# Patient Record
Sex: Male | Born: 1990 | Race: White | Hispanic: No | Marital: Single | Smoking: Former smoker
Health system: Southern US, Community
[De-identification: ages and names within clinical notes are randomized; demographics above are authoritative.]

## PROBLEM LIST (undated history)

## (undated) DIAGNOSIS — J45909 Unspecified asthma, uncomplicated: Secondary | ICD-10-CM

## (undated) DIAGNOSIS — M419 Scoliosis, unspecified: Secondary | ICD-10-CM

---
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
## 2011-04-24 ENCOUNTER — Emergency Department (HOSPITAL_COMMUNITY)
Admission: EM | Admit: 2011-04-24 | Discharge: 2011-04-24 | Disposition: A | Discharge status: Home / Self Care | Payer: Self-pay | Attending: Emergency Medicine | Admitting: Emergency Medicine

## 2011-04-24 ENCOUNTER — Encounter (HOSPITAL_COMMUNITY): Payer: Self-pay | Admitting: *Deleted

## 2011-04-24 ENCOUNTER — Emergency Department (HOSPITAL_COMMUNITY): Payer: Self-pay

## 2011-04-24 DIAGNOSIS — M47817 Spondylosis without myelopathy or radiculopathy, lumbosacral region: Secondary | ICD-10-CM | POA: Insufficient documentation

## 2011-04-24 DIAGNOSIS — X58XXXA Exposure to other specified factors, initial encounter: Secondary | ICD-10-CM | POA: Insufficient documentation

## 2011-04-24 DIAGNOSIS — S335XXA Sprain of ligaments of lumbar spine, initial encounter: Secondary | ICD-10-CM | POA: Insufficient documentation

## 2011-04-24 DIAGNOSIS — S39012A Strain of muscle, fascia and tendon of lower back, initial encounter: Secondary | ICD-10-CM

## 2011-04-24 MED ORDER — CYCLOBENZAPRINE HCL 10 MG PO TABS
10.0000 mg | ORAL_TABLET | Freq: Once | ORAL | Status: AC
  Administered 2011-04-24: 10 mg via ORAL
  Filled 2011-04-24: qty 1

## 2011-04-24 MED ORDER — CYCLOBENZAPRINE HCL 10 MG PO TABS: ORAL_TABLET | ORAL | Status: DC

## 2011-04-24 MED ORDER — IBUPROFEN 800 MG PO TABS
800.0000 mg | ORAL_TABLET | Freq: Once | ORAL | Status: AC
  Administered 2011-04-24: 800 mg via ORAL
  Filled 2011-04-24: qty 1

## 2011-04-24 NOTE — ED Provider Notes (Signed)
History     CSN: [id number]  Arrival date & time 04/24/11  1600   None     Chief Complaint  Patient presents with  . Back Pain    (Consider location/radiation/quality/duration/timing/severity/associated sxs/prior treatment) Patient is a 21 y.o. male presenting with back pain. The history is provided by the patient. No language interpreter was used.  Back Pain  This is a new problem. Episode onset: one month ago. The problem occurs constantly. The problem has not changed since onset.The pain is associated with no known injury. The pain is present in the lumbar spine. The quality of the pain is described as aching. The pain does not radiate. The pain is moderate. The pain is worse during the day. Pertinent negatives include no fever, no numbness, no paresthesias, no paresis and no weakness. He has tried NSAIDs for the symptoms. The treatment provided mild relief.    History reviewed. No pertinent past medical history.  History reviewed. No pertinent past surgical history.  History reviewed. No pertinent family history.  History  Substance Use Topics  . Smoking status: Current Everyday Smoker  . Smokeless tobacco: Not on file  . Alcohol Use: No      Review of Systems  Constitutional: Negative for fever and chills.  Musculoskeletal: Positive for back pain.  Neurological: Negative for weakness, numbness and paresthesias.  All other systems reviewed and are negative.    Allergies  Review of patient's allergies indicates no known allergies.  Home Medications   Current Outpatient Rx  Name Route Sig Dispense Refill  . CYCLOBENZAPRINE HCL 10 MG PO TABS  1/2 to one tab po TID prn low back pain 20 tablet 0    BP 133/64  Pulse 61  Temp 97.4 F (36.3 C)  Resp 18  SpO2 100%  Physical Exam  Nursing note and vitals reviewed. Constitutional: He is oriented to person, place, and time. He appears well-developed and well-nourished.  HENT:  Head: Normocephalic and  atraumatic.  Eyes: EOM are normal.  Neck: Normal range of motion.  Cardiovascular: Normal rate, regular rhythm, normal heart sounds and intact distal pulses.   Pulmonary/Chest: Effort normal and breath sounds normal. No respiratory distress.  Abdominal: Soft. He exhibits no distension. There is no tenderness.  Musculoskeletal: He exhibits tenderness.       Lumbar back: He exhibits decreased range of motion, tenderness, bony tenderness and pain. He exhibits no swelling, no deformity and no laceration.       Back:  Neurological: He is alert and oriented to person, place, and time. He has normal strength. No sensory deficit. Coordination and gait normal. GCS eye subscore is 4. GCS verbal subscore is 5. GCS motor subscore is 6.  Reflex Scores:      Patellar reflexes are 2+ on the right side and 2+ on the left side.      Achilles reflexes are 2+ on the right side and 2+ on the left side. Skin: Skin is warm and dry.  Psychiatric: He has a normal mood and affect. Judgment normal.    ED Course  Procedures (including critical care time)  Labs Reviewed - No data to display No results found.   1. Lumbar strain       MDM  Doubt spinal abscess.  Doubt caudal equina .  sxs c/w muscle strain. rx flexeril, 20 Ibuprofen 600 mg TID with food Ice F/u at health dept.        Worthy Rancher, PA 05/03/11 [phone]

## 2011-04-24 NOTE — Discharge Instructions (Signed)
Cryotherapy Cryotherapy means treatment with cold. Ice or gel packs can be used to reduce both pain and swelling. Ice is the most helpful within the first 24 to 48 hours after an injury or flareup from overusing a muscle or joint. Sprains, strains, spasms, burning pain, shooting pain, and aches can all be eased with ice. Ice can also be used when recovering from surgery. Ice is effective, has very few side effects, and is safe for most people to use. PRECAUTIONS  Ice is not a safe treatment option for people with:  Raynaud's phenomenon. This is a condition affecting small blood vessels in the extremities. Exposure to cold may cause your problems to return.   Cold hypersensitivity. There are many forms of cold hypersensitivity, including:   Cold urticaria. Red, itchy hives appear on the skin when the tissues begin to warm after being iced.   Cold erythema. This is a red, itchy rash caused by exposure to cold.   Cold hemoglobinuria. Red blood cells break down when the tissues begin to warm after being iced. The hemoglobin that carry oxygen are passed into the urine because they cannot combine with blood proteins fast enough.   Numbness or altered sensitivity in the area being iced.  If you have any of the following conditions, do not use ice until you have discussed cryotherapy with your caregiver:  Heart conditions, such as arrhythmia, angina, or chronic heart disease.   High blood pressure.   Healing wounds or open skin in the area being iced.   Current infections.   Rheumatoid arthritis.   Poor circulation.   Diabetes.  Ice slows the blood flow in the region it is applied. This is beneficial when trying to stop inflamed tissues from spreading irritating chemicals to surrounding tissues. However, if you expose your skin to cold temperatures for too long or without the proper protection, you can damage your skin or nerves. Watch for signs of skin damage due to cold. HOME CARE  INSTRUCTIONS Follow these tips to use ice and cold packs safely.  Place a dry or damp towel between the ice and skin. A damp towel will cool the skin more quickly, so you may need to shorten the time that the ice is used.   For a more rapid response, add gentle compression to the ice.   Ice for no more than 10 to 20 minutes at a time. The bonier the area you are icing, the less time it will take to get the benefits of ice.   Check your skin after 5 minutes to make sure there are no signs of a poor response to cold or skin damage.   Rest 20 minutes or more in between uses.   Once your skin is numb, you can end your treatment. You can test numbness by very lightly touching your skin. The touch should be so light that you do not see the skin dimple from the pressure of your fingertip. When using ice, most people will feel these normal sensations in this order: cold, burning, aching, and numbness.   Do not use ice on someone who cannot communicate their responses to pain, such as small children or people with dementia.  HOW TO MAKE AN ICE PACK Ice packs are the most common way to use ice therapy. Other methods include ice massage, ice baths, and cryo-sprays. Muscle creams that cause a cold, tingly feeling do not offer the same benefits that ice offers and should not be used as a substitute  unless recommended by your caregiver. To make an ice pack, do one of the following:  Place crushed ice or a bag of frozen vegetables in a sealable plastic bag. Squeeze out the excess air. Place this bag inside another plastic bag. Slide the bag into a pillowcase or place a damp towel between your skin and the bag.   Mix 3 parts water with 1 part rubbing alcohol. Freeze the mixture in a sealable plastic bag. When you remove the mixture from the freezer, it will be slushy. Squeeze out the excess air. Place this bag inside another plastic bag. Slide the bag into a pillowcase or place a damp towel between your skin  and the bag.  SEEK MEDICAL CARE IF:  You develop white spots on your skin. This may give the skin a blotchy (mottled) appearance.   Your skin turns blue or pale.   Your skin becomes waxy or hard.   Your swelling gets worse.  MAKE SURE YOU:   Understand these instructions.   Will watch your condition.   Will get help right away if you are not doing well or get worse.  Document Released: 08/18/2010 Document Revised: 12/11/2010 Document Reviewed: 08/18/2010 [facility] Patient Information 2012 [place], [place].Muscle Strain A muscle strain, or pulled muscle, occurs when a muscle is over-stretched. A small number of muscle fibers may also be torn. This is especially common in athletes. This happens when a sudden violent force placed on a muscle pushes it past its capacity. Usually, recovery from a pulled muscle takes 1 to 2 weeks. But complete healing will take 5 to 6 weeks. There are millions of muscle fibers. Following injury, your body will usually return to normal quickly. HOME CARE INSTRUCTIONS   While awake, apply ice to the sore muscle for 15 to 20 minutes each hour for the first 2 days. Put ice in a plastic bag and place a towel between the bag of ice and your skin.   Do not use the pulled muscle for several days. Do not use the muscle if you have pain.   You may wrap the injured area with an elastic bandage for comfort. Be careful not to bind it too tightly. This may interfere with blood circulation.   Only take over-the-counter or prescription medicines for pain, discomfort, or fever as directed by your caregiver. Do not use aspirin as this will increase bleeding (bruising) at injury site.   Warming up before exercise helps prevent muscle strains.  SEEK MEDICAL CARE IF:  There is increased pain or swelling in the affected area. MAKE SURE YOU:   Understand these instructions.   Will watch your condition.   Will get help right away if you are not doing well or get worse.    Document Released: 12/22/2004 Document Revised: 12/11/2010 Document Reviewed: 07/21/2006 [facility] Patient Information 2012 [place], [place].   Take the flexeril as directed.  Take ibuprofen up to 600 mg every 8 hrs with food.  Apply ice  Several times daily to lower back.  Call the health dept and make arrangements for routine medical care.  Call either of the orthopedic referral for further evaluation of your low back pain.

## 2011-04-24 NOTE — ED Notes (Signed)
Pt presents to er with c/o lower back pain that started hurting about a month ago, states that it has gotten worse over the past few days, pain worse with movement, denies any injury,

## 2011-05-05 NOTE — ED Provider Notes (Signed)
Medical screening examination/treatment/procedure(s) were performed by non-physician practitioner and as supervising physician I was immediately available for consultation/collaboration.   Glennon Kopko L Emmaleigh Longo, MD 05/05/11 1239 

## 2011-06-27 ENCOUNTER — Encounter (HOSPITAL_COMMUNITY): Payer: Self-pay | Admitting: *Deleted

## 2011-06-27 ENCOUNTER — Emergency Department (HOSPITAL_COMMUNITY)
Admission: EM | Admit: 2011-06-27 | Discharge: 2011-06-28 | Disposition: A | Discharge status: Home / Self Care | Payer: Self-pay | Attending: Emergency Medicine | Admitting: Emergency Medicine

## 2011-06-27 DIAGNOSIS — R112 Nausea with vomiting, unspecified: Secondary | ICD-10-CM | POA: Insufficient documentation

## 2011-06-27 DIAGNOSIS — R197 Diarrhea, unspecified: Secondary | ICD-10-CM | POA: Insufficient documentation

## 2011-06-27 NOTE — ED Notes (Signed)
Pt c/o n/v/d x 1 wk.

## 2011-06-28 LAB — DIFFERENTIAL
Basophils Absolute: 0.1 10*3/uL (ref 0.0–0.1)
Basophils Relative: 1 % (ref 0–1)
Eosinophils Absolute: 0.1 10*3/uL (ref 0.0–0.7)
Eosinophils Relative: 1 % (ref 0–5)
Lymphocytes Relative: 49 % — ABNORMAL HIGH (ref 12–46)
Lymphs Abs: 3.6 10*3/uL (ref 0.7–4.0)
Monocytes Absolute: 0.6 10*3/uL (ref 0.1–1.0)
Monocytes Relative: 9 % (ref 3–12)
Neutro Abs: 2.9 10*3/uL (ref 1.7–7.7)
Neutrophils Relative %: 40 % — ABNORMAL LOW (ref 43–77)

## 2011-06-28 LAB — URINALYSIS, ROUTINE W REFLEX MICROSCOPIC
Bilirubin Urine: NEGATIVE
Glucose, UA: NEGATIVE mg/dL
Hgb urine dipstick: NEGATIVE
Ketones, ur: NEGATIVE mg/dL
Leukocytes, UA: NEGATIVE
Nitrite: NEGATIVE
Protein, ur: NEGATIVE mg/dL
Specific Gravity, Urine: 1.025 (ref 1.005–1.030)
Urobilinogen, UA: 0.2 mg/dL (ref 0.0–1.0)
pH: 6.5 (ref 5.0–8.0)

## 2011-06-28 LAB — COMPREHENSIVE METABOLIC PANEL
Alkaline Phosphatase: 70 U/L (ref 39–117)
BUN: 14 mg/dL (ref 6–23)
CO2: 26 mEq/L (ref 19–32)
Chloride: 101 mEq/L (ref 96–112)
Creatinine, Ser: 1 mg/dL (ref 0.50–1.35)
GFR calc Af Amer: >90 mL/min (ref >90)
GFR calc non Af Amer: >90 mL/min (ref >90)
Glucose, Bld: 56 mg/dL — ABNORMAL LOW (ref 70–99)
Potassium: 3.6 mEq/L (ref 3.5–5.1)
Total Bilirubin: 0.3 mg/dL (ref 0.3–1.2)

## 2011-06-28 LAB — CBC
HCT: 38.6 % — ABNORMAL LOW (ref 39.0–52.0)
Hemoglobin: 13.8 g/dL (ref 13.0–17.0)
MCH: 33.3 pg (ref 26.0–34.0)
MCHC: 35.8 g/dL (ref 30.0–36.0)
MCV: 93 fL (ref 78.0–100.0)
Platelets: 242 10*3/uL (ref 150–400)
RBC: 4.15 MIL/uL — ABNORMAL LOW (ref 4.22–5.81)
RDW: 11.9 % (ref 11.5–15.5)
WBC: 7.2 10*3/uL (ref 4.0–10.5)

## 2011-06-28 MED ORDER — ONDANSETRON 4 MG PO TBDP
4.0000 mg | ORAL_TABLET | Freq: Once | ORAL | Status: AC
  Administered 2011-06-28: 4 mg via ORAL
  Filled 2011-06-28: qty 1

## 2011-06-28 MED ORDER — ONDANSETRON HCL 4 MG PO TABS: 4.0000 mg | ORAL_TABLET | Freq: Four times a day (QID) | ORAL | Status: AC

## 2011-06-28 NOTE — ED Provider Notes (Signed)
History     CSN: [id number]  Arrival date & time 06/27/11  2239   First MD Initiated Contact with Patient 06/27/11 2354      Chief Complaint  Patient presents with  . Nausea  . Emesis  . Diarrhea    (Consider location/radiation/quality/duration/timing/severity/associated sxs/prior treatment) HPI Comments: Patient presents with nausea, vomiting and diarrhea for the past 3 days. He sat up to 2 episodes of vomiting daily in 4-5 loose stools daily. He states he's had no diarrhea today use of vomiting. Denies abdominal pain, fever, chest pain, shortness of breath. No sick contacts. No pain with urination. No recent travel or unusual foods. No blood in emesis or stool.  The history is provided by the patient.    History reviewed. No pertinent past medical history.  History reviewed. No pertinent past surgical history.  History reviewed. No pertinent family history.  History  Substance Use Topics  . Smoking status: Current Everyday Smoker  . Smokeless tobacco: Not on file  . Alcohol Use: No      Review of Systems  Constitutional: Negative for fever and activity change.  HENT: Negative for congestion and rhinorrhea.   Respiratory: Negative for cough, chest tightness and shortness of breath.   Cardiovascular: Negative for chest pain.  Gastrointestinal: Positive for nausea, vomiting and diarrhea. Negative for abdominal pain.  Genitourinary: Negative for dysuria and hematuria.  Musculoskeletal: Negative for back pain.  Skin: Negative for rash.  Neurological: Negative for dizziness, weakness, light-headedness and headaches.  Hematological: Negative for adenopathy.    Allergies  Review of patient's allergies indicates no known allergies.  Home Medications  No current outpatient prescriptions on file.  BP 122/58  Pulse 51  Temp 97.9 F (36.6 C) (Oral)  Resp 16  Ht 5\' 6"  (1.676 m)  Wt 120 lb (54.432 kg)  BMI 19.37 kg/m2  SpO2 100%  Physical Exam  Constitutional: He  is oriented to person, place, and time. He appears well-developed and well-nourished. No distress.  HENT:  Head: Normocephalic and atraumatic.  Mouth/Throat: Oropharynx is clear and moist. No oropharyngeal exudate.       Moist mucous membranes  Eyes: Conjunctivae and EOM are normal. Pupils are equal, round, and reactive to light.  Neck: Normal range of motion. Neck supple.  Cardiovascular: Normal rate, regular rhythm and normal heart sounds.   No murmur heard. Pulmonary/Chest: Effort normal and breath sounds normal. No respiratory distress.  Abdominal: Soft. There is no tenderness. There is no rebound and no guarding.  Musculoskeletal: Normal range of motion. He exhibits no edema and no tenderness.  Neurological: He is alert and oriented to person, place, and time. No cranial nerve deficit.  Skin: Skin is warm.    ED Course  Procedures (including critical care time)  Labs Reviewed  CBC - Abnormal; Notable for the following:    RBC 4.15 (*)     HCT 38.6 (*)     All other components within normal limits  DIFFERENTIAL - Abnormal; Notable for the following:    Neutrophils Relative 40 (*)     Lymphocytes Relative 49 (*)     All other components within normal limits  COMPREHENSIVE METABOLIC PANEL - Abnormal; Notable for the following:    Glucose, Bld 56 (*)     All other components within normal limits  URINALYSIS, ROUTINE W REFLEX MICROSCOPIC   No results found.   No diagnosis found.    MDM  3 days of nausea, vomiting and diarrhea. Abdomen benign. Vitals stable.  Mucous membranes moist. Suspect viral gastroenteritis. By mouth Zofran, fluid  No vomiting in the ED. Tolerating PO and stable for discharge.      Glynn Octave, MD 06/28/11 [phone]

## 2011-06-28 NOTE — Discharge Instructions (Signed)
Nausea and Vomiting  Nausea is a sick feeling that often comes before throwing up (vomiting). Vomiting is a reflex where stomach contents come out of your mouth. Vomiting can cause severe loss of body fluids (dehydration). Children and elderly adults can become dehydrated quickly, especially if they also have diarrhea. Nausea and vomiting are symptoms of a condition or disease. It is important to find the cause of your symptoms.  CAUSES    Direct irritation of the stomach lining. This irritation can result from increased acid production (gastroesophageal reflux disease), infection, food poisoning, taking certain medicines (such as nonsteroidal anti-inflammatory drugs), alcohol use, or tobacco use.   Signals from the brain.These signals could be caused by a headache, heat exposure, an inner ear disturbance, increased pressure in the brain from injury, infection, a tumor, or a concussion, pain, emotional stimulus, or metabolic problems.   An obstruction in the gastrointestinal tract (bowel obstruction).   Illnesses such as diabetes, hepatitis, gallbladder problems, appendicitis, kidney problems, cancer, sepsis, atypical symptoms of a heart attack, or eating disorders.   Medical treatments such as chemotherapy and radiation.   Receiving medicine that makes you sleep (general anesthetic) during surgery.  DIAGNOSIS  Your caregiver may ask for tests to be done if the problems do not improve after a few days. Tests may also be done if symptoms are severe or if the reason for the nausea and vomiting is not clear. Tests may include:   Urine tests.   Blood tests.   Stool tests.   Cultures (to look for evidence of infection).   X-rays or other imaging studies.  Test results can help your caregiver make decisions about treatment or the need for additional tests.  TREATMENT  You need to stay well hydrated. Drink frequently but in small amounts.You may wish to drink water, sports drinks, clear broth, or eat frozen  ice pops or gelatin dessert to help stay hydrated.When you eat, eating slowly may help prevent nausea.There are also some antinausea medicines that may help prevent nausea.  HOME CARE INSTRUCTIONS    Take all medicine as directed by your caregiver.   If you do not have an appetite, do not force yourself to eat. However, you must continue to drink fluids.   If you have an appetite, eat a normal diet unless your caregiver tells you differently.   Eat a variety of complex carbohydrates (rice, wheat, potatoes, bread), lean meats, yogurt, fruits, and vegetables.   Avoid high-fat foods because they are more difficult to digest.   Drink enough water and fluids to keep your urine clear or pale yellow.   If you are dehydrated, ask your caregiver for specific rehydration instructions. Signs of dehydration may include:   Severe thirst.   Dry lips and mouth.   Dizziness.   Dark urine.   Decreasing urine frequency and amount.   Confusion.   Rapid breathing or pulse.  SEEK IMMEDIATE MEDICAL CARE IF:    You have blood or brown flecks (like coffee grounds) in your vomit.   You have black or bloody stools.   You have a severe headache or stiff neck.   You are confused.   You have severe abdominal pain.   You have chest pain or trouble breathing.   You do not urinate at least once every 8 hours.   You develop cold or clammy skin.   You continue to vomit for longer than 24 to 48 hours.   You have a fever.  MAKE SURE YOU:      Understand these instructions.   Will watch your condition.   Will get help right away if you are not doing well or get worse.  Document Released: 12/22/2004 Document Revised: 12/11/2010 Document Reviewed: 05/21/2010  [employer] Patient Information 2012 [employer].

## 2012-03-07 ENCOUNTER — Encounter (HOSPITAL_COMMUNITY): Payer: Self-pay

## 2012-03-07 ENCOUNTER — Emergency Department (HOSPITAL_COMMUNITY)
Admission: EM | Admit: 2012-03-07 | Discharge: 2012-03-07 | Disposition: A | Discharge status: Home / Self Care | Payer: Self-pay | Attending: Emergency Medicine | Admitting: Emergency Medicine

## 2012-03-07 DIAGNOSIS — M47817 Spondylosis without myelopathy or radiculopathy, lumbosacral region: Secondary | ICD-10-CM | POA: Insufficient documentation

## 2012-03-07 MED ORDER — PREDNISONE 10 MG PO TABS: ORAL_TABLET | ORAL | Status: DC

## 2012-03-07 MED ORDER — DICLOFENAC SODIUM 75 MG PO TBEC: 75.0000 mg | DELAYED_RELEASE_TABLET | Freq: Two times a day (BID) | ORAL | Status: DC

## 2012-03-07 NOTE — ED Notes (Signed)
Low back pain for "a year".  No injury known.  Pain has been worse last few days.

## 2012-03-07 NOTE — ED Notes (Signed)
No answer in waiting room X1 

## 2012-03-07 NOTE — ED Provider Notes (Signed)
History     CSN: [id number]  Arrival date & time 03/07/12  1325   First MD Initiated Contact with Patient 03/07/12 1434      Chief Complaint  Patient presents with  . Back Pain    (Consider location/radiation/quality/duration/timing/severity/associated sxs/prior treatment) Patient is a 22 y.o. male presenting with back pain. The history is provided by the patient. No language interpreter was used.  Back Pain Location:  Lumbar spine Quality:  Aching Pain severity:  No pain Pain is:  Worse during the day Timing:  Constant Chronicity:  New Pt complains pain in his low back.  Pt reports he was seen here for similar.   Pt has not had follow up.    History reviewed. No pertinent past medical history.  History reviewed. No pertinent past surgical history.  No family history on file.  History  Substance Use Topics  . Smoking status: Never Smoker   . Smokeless tobacco: Current User    Types: Snuff  . Alcohol Use: No      Review of Systems  Musculoskeletal: Positive for back pain.  All other systems reviewed and are negative.    Allergies  Review of patient's allergies indicates no known allergies.  Home Medications  No current outpatient prescriptions on file.  BP 124/61  Temp(Src) 97.8 F (36.6 C) (Oral)  Resp 18  Ht 5\' 10"  (1.778 m)  Wt 120 lb (54.432 kg)  BMI 17.22 kg/m2  SpO2 100%  Physical Exam  Nursing note and vitals reviewed. Constitutional: He is oriented to person, place, and time. He appears well-developed and well-nourished.  HENT:  Head: Normocephalic.  Right Ear: External ear normal.  Left Ear: External ear normal.  Eyes: Conjunctivae and EOM are normal. Pupils are equal, round, and reactive to light.  Neck: Normal range of motion. Neck supple.  Cardiovascular: Normal rate and normal heart sounds.   Pulmonary/Chest: Effort normal.  Abdominal: Soft.  Musculoskeletal: He exhibits tenderness.  Tender lower lumbar spine.     Neurological: He  is alert and oriented to person, place, and time. He has normal reflexes.  Skin: Skin is warm.  Psychiatric: He has a normal mood and affect.    ED Course  Procedures (including critical care time)  Labs Reviewed - No data to display No results found.   No diagnosis found.    MDM  Pt advised to follow up with Orthopaedist for evaluation        Elson Areas, [place] 03/07/12 1508

## 2012-03-07 NOTE — ED Notes (Signed)
Pt arrives to er with c/o back pain. He states he came here a few months ago with this complaint and the Dr told him his cartilage was breaking down. Pt denies any injury to back but states his back pain radiates down his left arm.

## 2012-03-08 NOTE — ED Provider Notes (Signed)
Medical screening examination/treatment/procedure(s) were performed by non-physician practitioner and as supervising physician I was immediately available for consultation/collaboration.   Laray Anger, DO 03/08/12 1132

## 2012-09-14 ENCOUNTER — Emergency Department (HOSPITAL_COMMUNITY)
Admission: EM | Admit: 2012-09-14 | Discharge: 2012-09-14 | Disposition: A | Discharge status: Home / Self Care | Payer: Self-pay | Attending: Emergency Medicine | Admitting: Emergency Medicine

## 2012-09-14 ENCOUNTER — Encounter (HOSPITAL_COMMUNITY): Payer: Self-pay | Admitting: *Deleted

## 2012-09-14 DIAGNOSIS — S61409A Unspecified open wound of unspecified hand, initial encounter: Secondary | ICD-10-CM | POA: Insufficient documentation

## 2012-09-14 DIAGNOSIS — W268XXA Contact with other sharp object(s), not elsewhere classified, initial encounter: Secondary | ICD-10-CM | POA: Insufficient documentation

## 2012-09-14 DIAGNOSIS — Y929 Unspecified place or not applicable: Secondary | ICD-10-CM | POA: Insufficient documentation

## 2012-09-14 DIAGNOSIS — S61411A Laceration without foreign body of right hand, initial encounter: Secondary | ICD-10-CM

## 2012-09-14 DIAGNOSIS — Y939 Activity, unspecified: Secondary | ICD-10-CM | POA: Insufficient documentation

## 2012-09-14 DIAGNOSIS — Z23 Encounter for immunization: Secondary | ICD-10-CM | POA: Insufficient documentation

## 2012-09-14 MED ORDER — TETANUS-DIPHTH-ACELL PERTUSSIS 5-2.5-18.5 LF-MCG/0.5 IM SUSP
0.5000 mL | Freq: Once | INTRAMUSCULAR | Status: AC
  Administered 2012-09-14: 0.5 mL via INTRAMUSCULAR
  Filled 2012-09-14: qty 0.5

## 2012-09-14 MED ORDER — LIDOCAINE HCL (PF) 1 % IJ SOLN
5.0000 mL | Freq: Once | INTRAMUSCULAR | Status: AC
  Administered 2012-09-14: 5 mL via INTRADERMAL
  Filled 2012-09-14: qty 5

## 2012-09-14 NOTE — ED Notes (Signed)
Pt cut right palm on a piece of siding earlier today. Bleeding controlled.

## 2012-09-14 NOTE — ED Provider Notes (Signed)
CSN: [id number]     Arrival date & time 09/14/12  1924 History   First MD Initiated Contact with Patient 09/14/12 1947     Chief Complaint  Patient presents with  . Extremity Laceration   (Consider location/radiation/quality/duration/timing/severity/associated sxs/prior Treatment) Patient is a 22 y.o. male presenting with skin laceration. The history is provided by the patient.  Laceration Location:  Hand Hand laceration location: Right thenar eminence. Length (cm):  2 Depth:  Through dermis Quality: straight   Bleeding: controlled   Time since incident:  5 hours Laceration mechanism:  Metal edge Pain details:    Quality:  Aching   Severity:  Mild   Timing:  Constant   Progression:  Unchanged Foreign body present:  No foreign bodies Relieved by:  Nothing Worsened by:  Movement Ineffective treatments:  None tried Tetanus status:  Unknown   History reviewed. No pertinent past medical history. History reviewed. No pertinent past surgical history. History reviewed. No pertinent family history. History  Substance Use Topics  . Smoking status: Never Smoker   . Smokeless tobacco: Current User    Types: Snuff  . Alcohol Use: No    Review of Systems  Constitutional: Negative for fever and chills.  Musculoskeletal: Negative for back pain, joint swelling and arthralgias.  Skin: Positive for wound.       Laceration   Neurological: Negative for dizziness, weakness and numbness.  Hematological: Does not bruise/bleed easily.  All other systems reviewed and are negative.    Allergies  Review of patient's allergies indicates no known allergies.  Home Medications  No current outpatient prescriptions on file. BP 113/59  Pulse 91  Temp(Src) 98.3 F (36.8 C) (Oral)  Resp 24  Ht 5\' 8"  (1.727 m)  Wt 125 lb (56.7 kg)  BMI 19.01 kg/m2  SpO2 100% Physical Exam  Nursing note and vitals reviewed. Constitutional: He is oriented to person, place, and time. He appears  well-developed and well-nourished. No distress.  HENT:  Head: Normocephalic and atraumatic.  Cardiovascular: Normal rate, regular rhythm, normal heart sounds and intact distal pulses.   No murmur heard. Pulmonary/Chest: Effort normal and breath sounds normal. No respiratory distress.  Musculoskeletal: He exhibits tenderness. He exhibits no edema.       Right hand: He exhibits tenderness and laceration. He exhibits normal range of motion, no bony tenderness, normal two-point discrimination, no deformity and no swelling. Normal sensation noted. Normal strength noted.       Hands: Neurological: He is alert and oriented to person, place, and time. He exhibits normal muscle tone. Coordination normal.  Skin: Skin is warm. Laceration noted.  See MS exam    ED Course  Procedures (including critical care time) Labs Review Labs Reviewed - No data to display Imaging Review No results found.  MDM   LACERATION REPAIR Performed by: Akina Maish L. Authorized by: Maxwell Caul Consent: Verbal consent obtained. Risks and benefits: risks, benefits and alternatives were discussed Consent given by: patient Patient identity confirmed: provided demographic data Prepped and Draped in normal sterile fashion Wound explored  Laceration Location: Thenar eminence of the right hand Laceration Length: 2 cm  No Foreign Bodies seen or palpated  Anesthesia: local infiltration  Local anesthetic: lidocaine 1 % without epinephrine  Anesthetic total: 2 ml  Irrigation method: syringe Amount of cleaning: standard  Skin closure: 4-0 Ethilon Number of sutures: 3 Technique: Simple interrupted  Patient tolerance: Patient tolerated the procedure well with no immediate complications.   TDaP Updated.  Wound was bandaged  by nursing staff  Wound(s) explored with adequate hemostasis through ROM, no apparent gross foreign body retained, no significant involvement of deep structures such as bone / joint  / tendon / or neurovascular involvement noted.  Baseline Strength and Sensation to affected extremity(ies) with normal light touch for Pt, distal NVI with CR< 2 secs and pulse(s) intact to affected extremity(ies).   Patient agrees to keep wound clean and bandaged, sutures out in 10 days. Advised to return here for any signs of infection. Patient has full range of motion of the right hand , Distal sensation is intact, neurovascularly intact   Angeleena Dueitt L. Trisha Mangle, PA-C 09/14/12 2043

## 2012-09-15 NOTE — ED Provider Notes (Signed)
Medical screening examination/treatment/procedure(s) were performed by non-physician practitioner and as supervising physician I was immediately available for consultation/collaboration.    Gilda Crease, MD 09/15/12 [phone]

## 2013-08-04 ENCOUNTER — Encounter (HOSPITAL_COMMUNITY): Payer: Self-pay | Admitting: Emergency Medicine

## 2013-08-04 ENCOUNTER — Emergency Department (HOSPITAL_COMMUNITY)
Admission: EM | Admit: 2013-08-04 | Discharge: 2013-08-05 | Disposition: A | Discharge status: Home / Self Care | Payer: Self-pay | Attending: Emergency Medicine | Admitting: Emergency Medicine

## 2013-08-04 ENCOUNTER — Emergency Department (HOSPITAL_COMMUNITY): Payer: Self-pay

## 2013-08-04 DIAGNOSIS — Z79899 Other long term (current) drug therapy: Secondary | ICD-10-CM | POA: Insufficient documentation

## 2013-08-04 DIAGNOSIS — J45901 Unspecified asthma with (acute) exacerbation: Secondary | ICD-10-CM | POA: Insufficient documentation

## 2013-08-04 DIAGNOSIS — J40 Bronchitis, not specified as acute or chronic: Secondary | ICD-10-CM

## 2013-08-04 DIAGNOSIS — R509 Fever, unspecified: Secondary | ICD-10-CM | POA: Insufficient documentation

## 2013-08-04 DIAGNOSIS — R0602 Shortness of breath: Secondary | ICD-10-CM | POA: Insufficient documentation

## 2013-08-04 NOTE — ED Notes (Signed)
Pt states at times he feels like he cannot get a deep breath, has been coughing a lot and having diff sleeping due to same.  Pt admits to some chest discomfort with coughing

## 2013-08-05 MED ORDER — ALBUTEROL SULFATE HFA 108 (90 BASE) MCG/ACT IN AERS
2.0000 | INHALATION_SPRAY | RESPIRATORY_TRACT | Status: DC
  Administered 2013-08-05: 2 via RESPIRATORY_TRACT
  Filled 2013-08-05: qty 6.7

## 2013-08-05 MED ORDER — PREDNISONE 10 MG PO TABS: ORAL_TABLET | ORAL | Status: DC

## 2013-08-05 MED ORDER — AZITHROMYCIN 250 MG PO TABS: ORAL_TABLET | ORAL | Status: DC

## 2013-08-05 MED ORDER — AZITHROMYCIN 250 MG PO TABS
500.0000 mg | ORAL_TABLET | Freq: Once | ORAL | Status: AC
  Administered 2013-08-05: 500 mg via ORAL
  Filled 2013-08-05: qty 2

## 2013-08-05 MED ORDER — IPRATROPIUM-ALBUTEROL 0.5-2.5 (3) MG/3ML IN SOLN
3.0000 mL | Freq: Once | RESPIRATORY_TRACT | Status: AC
  Administered 2013-08-05: 3 mL via RESPIRATORY_TRACT
  Filled 2013-08-05: qty 3

## 2013-08-05 MED ORDER — PREDNISONE 50 MG PO TABS
60.0000 mg | ORAL_TABLET | Freq: Every day | ORAL | Status: DC
  Administered 2013-08-05: 60 mg via ORAL
  Filled 2013-08-05 (×3): qty 1

## 2013-08-05 NOTE — ED Provider Notes (Signed)
Medical screening examination/treatment/procedure(s) were performed by non-physician practitioner and as supervising physician I was immediately available for consultation/collaboration.   Dione Boozeavid Kazuma Elena, MD 08/05/13 [phone]

## 2013-08-05 NOTE — Discharge Instructions (Signed)
Asthma °Asthma is a recurring condition in which the airways tighten and narrow. Asthma can make it difficult to breathe. It can cause coughing, wheezing, and shortness of breath. Asthma episodes, also called asthma attacks, range from minor to life-threatening. Asthma cannot be cured, but medicines and lifestyle changes can help control it. °CAUSES °Asthma is believed to be caused by inherited (genetic) and environmental factors, but its exact cause is unknown. Asthma may be triggered by allergens, lung infections, or irritants in the air. Asthma triggers are different for each person. Common triggers include:  °· Animal dander. °· Dust mites. °· Cockroaches. °· Pollen from trees or grass. °· Mold. °· Smoke. °· Air pollutants such as dust, household cleaners, hair sprays, aerosol sprays, paint fumes, strong chemicals, or strong odors. °· Cold air, weather changes, and winds (which increase molds and pollens in the air). °· Strong emotional expressions such as crying or laughing hard. °· Stress. °· Certain medicines (such as aspirin) or types of drugs (such as beta-blockers). °· Sulfites in foods and drinks. Foods and drinks that may contain sulfites include dried fruit, potato chips, and sparkling grape juice. °· Infections or inflammatory conditions such as the flu, a cold, or an inflammation of the nasal membranes (rhinitis). °· Gastroesophageal reflux disease (GERD). °· Exercise or strenuous activity. °SYMPTOMS °Symptoms may occur immediately after asthma is triggered or many hours later. Symptoms include: °· Wheezing. °· Excessive nighttime or early morning coughing. °· Frequent or severe coughing with a common cold. °· Chest tightness. °· Shortness of breath. °DIAGNOSIS  °The diagnosis of asthma is made by a review of your medical history and a physical exam. Tests may also be performed. These may include: °· Lung function studies. These tests show how much air you breathe in and out. °· Allergy  tests. °· Imaging tests such as X-rays. °TREATMENT  °Asthma cannot be cured, but it can usually be controlled. Treatment involves identifying and avoiding your asthma triggers. It also involves medicines. There are 2 classes of medicine used for asthma treatment:  °· Controller medicines. These prevent asthma symptoms from occurring. They are usually taken every day. °· Reliever or rescue medicines. These quickly relieve asthma symptoms. They are used as needed and provide short-term relief. °Your health care provider will help you create an asthma action plan. An asthma action plan is a written plan for managing and treating your asthma attacks. It includes a list of your asthma triggers and how they may be avoided. It also includes information on when medicines should be taken and when their dosage should be changed. An action plan may also involve the use of a device called a peak flow meter. A peak flow meter measures how well the lungs are working. It helps you monitor your condition. °HOME CARE INSTRUCTIONS  °· Take medicines only as directed by your health care provider. Speak with your health care provider if you have questions about how or when to take the medicines. °· Use a peak flow meter as directed by your health care provider. Record and keep track of readings. °· Understand and use the action plan to help minimize or stop an asthma attack without needing to seek medical care. °· Control your home environment in the following ways to help prevent asthma attacks: °¨ Do not smoke. Avoid being exposed to secondhand smoke. °¨ Change your heating and air conditioning filter regularly. °¨ Limit your use of fireplaces and wood stoves. °¨ Get rid of pests (such as roaches and   mice) and their droppings. °¨ Throw away plants if you see mold on them. °¨ Clean your floors and dust regularly. Use unscented cleaning products. °¨ Try to have someone else vacuum for you regularly. Stay out of rooms while they are  being vacuumed and for a short while afterward. If you vacuum, use a dust mask from a hardware store, a double-layered or microfilter vacuum cleaner bag, or a vacuum cleaner with a HEPA filter. °¨ Replace carpet with wood, tile, or vinyl flooring. Carpet can trap dander and dust. °¨ Use allergy-proof pillows, mattress covers, and box spring covers. °¨ Wash bed sheets and blankets every week in hot water and dry them in a dryer. °¨ Use blankets that are made of polyester or cotton. °¨ Clean bathrooms and kitchens with bleach. If possible, have someone repaint the walls in these rooms with mold-resistant paint. Keep out of the rooms that are being cleaned and painted. °¨ Wash hands frequently. °SEEK MEDICAL CARE IF:  °· You have wheezing, shortness of breath, or a cough even if taking medicine to prevent attacks. °· The colored mucus you cough up (sputum) is thicker than usual. °· Your sputum changes from clear or white to yellow, green, gray, or bloody. °· You have any problems that may be related to the medicines you are taking (such as a rash, itching, swelling, or trouble breathing). °· You are using a reliever medicine more than 2-3 times per week. °· Your peak flow is still at 50-79% of your personal best after following your action plan for 1 hour. °· You have a fever. °SEEK IMMEDIATE MEDICAL CARE IF:  °· You seem to be getting worse and are unresponsive to treatment during an asthma attack. °· You are short of breath even at rest. °· You get short of breath when doing very little physical activity. °· You have difficulty eating, drinking, or talking due to asthma symptoms. °· You develop chest pain. °· You develop a fast heartbeat. °· You have a bluish color to your lips or fingernails. °· You are light-headed, dizzy, or faint. °· Your peak flow is less than 50% of your personal best. °MAKE SURE YOU:  °· Understand these instructions. °· Will watch your condition. °· Will get help right away if you are not  doing well or get worse. °Document Released: 12/22/2004 Document Revised: 05/08/2013 Document Reviewed: 07/21/2012 °ExitCare® Patient Information ©2015 [employer]. This information is not intended to replace advice given to you by your health care provider. Make sure you discuss any questions you have with your health care provider. °Acute Bronchitis °Bronchitis is inflammation of the airways that extend from the windpipe into the lungs (bronchi). The inflammation often causes mucus to develop. This leads to a cough, which is the most common symptom of bronchitis.  °In acute bronchitis, the condition usually develops suddenly and goes away over time, usually in a couple weeks. Smoking, allergies, and asthma can make bronchitis worse. Repeated episodes of bronchitis may cause further lung problems.  °CAUSES °Acute bronchitis is most often caused by the same virus that causes a cold. The virus can spread from person to person (contagious) through coughing, sneezing, and touching contaminated objects. °SIGNS AND SYMPTOMS  °· Cough.   °· Fever.   °· Coughing up mucus.   °· Body aches.   °· Chest congestion.   °· Chills.   °· Shortness of breath.   °· Sore throat.   °DIAGNOSIS  °Acute bronchitis is usually diagnosed through a physical exam. Your health care provider will also   ask you questions about your medical history. Tests, such as chest X-rays, are sometimes done to rule out other conditions.  °TREATMENT  °Acute bronchitis usually goes away in a couple weeks. Oftentimes, no medical treatment is necessary. Medicines are sometimes given for relief of fever or cough. Antibiotic medicines are usually not needed but may be prescribed in certain situations. In some cases, an inhaler may be recommended to help reduce shortness of breath and control the cough. A cool mist vaporizer may also be used to help thin bronchial secretions and make it easier to clear the chest.  °HOME CARE INSTRUCTIONS °· Get plenty of rest.    °· Drink enough fluids to keep your urine clear or pale yellow (unless you have a medical condition that requires fluid restriction). Increasing fluids may help thin your respiratory secretions (sputum) and reduce chest congestion, and it will prevent dehydration.   °· Take medicines only as directed by your health care provider. °· If you were prescribed an antibiotic medicine, finish it all even if you start to feel better. °· Avoid smoking and secondhand smoke. Exposure to cigarette smoke or irritating chemicals will make bronchitis worse. If you are a smoker, consider using nicotine gum or skin patches to help control withdrawal symptoms. Quitting smoking will help your lungs heal faster.   °· Reduce the chances of another bout of acute bronchitis by washing your hands frequently, avoiding people with cold symptoms, and trying not to touch your hands to your mouth, nose, or eyes.   °· Keep all follow-up visits as directed by your health care provider.   °SEEK MEDICAL CARE IF: °Your symptoms do not improve after 1 week of treatment.  °SEEK IMMEDIATE MEDICAL CARE IF: °· You develop an increased fever or chills.   °· You have chest pain.   °· You have severe shortness of breath. °· You have bloody sputum.   °· You develop dehydration. °· You faint or repeatedly feel like you are going to pass out. °· You develop repeated vomiting. °· You develop a severe headache. °MAKE SURE YOU:  °· Understand these instructions. °· Will watch your condition. °· Will get help right away if you are not doing well or get worse. °Document Released: 01/30/2004 Document Revised: 05/08/2013 Document Reviewed: 06/14/2012 °ExitCare® Patient Information ©2015 [employer]. This information is not intended to replace advice given to you by your health care provider. Make sure you discuss any questions you have with your health care provider. ° °

## 2013-08-05 NOTE — ED Provider Notes (Signed)
CSN: [id number]     Arrival date & time 08/04/13  2303 History   First MD Initiated Contact with Patient 08/05/13 0007     No chief complaint on file.    (Consider location/radiation/quality/duration/timing/severity/associated sxs/prior Treatment) Patient is a 23 y.o. male presenting with cough. The history is provided by the patient.  Cough Cough characteristics:  Productive Sputum characteristics:  Clear Severity:  Moderate Onset quality:  Gradual Duration:  5 days Timing:  Constant Progression:  Worsening Chronicity:  New Smoker: no   Context: not upper respiratory infection   Relieved by:  Nothing Worsened by:  Nothing tried Ineffective treatments:  None tried Associated symptoms: fever and shortness of breath   Risk factors: no recent infection     History reviewed. No pertinent past medical history. History reviewed. No pertinent past surgical history. No family history on file. History  Substance Use Topics  . Smoking status: Never Smoker   . Smokeless tobacco: Current User    Types: Snuff  . Alcohol Use: Yes    Review of Systems  Constitutional: Positive for fever.  Respiratory: Positive for cough and shortness of breath.   All other systems reviewed and are negative.     Allergies  Review of patient's allergies indicates no known allergies.  Home Medications   Prior to Admission medications   Medication Sig Start Date End Date Taking? Authorizing Provider  guaiFENesin (MUCINEX) 600 MG 12 hr tablet Take by mouth 2 (two) times daily.   Yes Historical Provider, MD   BP 143/84  Pulse 77  Temp(Src) 98.2 F (36.8 C) (Oral)  Resp 18  Ht 5\' 6"  (1.676 m)  Wt 130 lb (58.968 kg)  BMI 20.99 kg/m2  SpO2 93% Physical Exam  Nursing note and vitals reviewed. Constitutional: He is oriented to person, place, and time. He appears well-developed and well-nourished.  HENT:  Head: Normocephalic and atraumatic.  Right Ear: External ear normal.  Left Ear:  External ear normal.  Mouth/Throat: Oropharynx is clear and moist.  Eyes: EOM are normal. Pupils are equal, round, and reactive to light.  Neck: Normal range of motion.  Cardiovascular: Normal rate and normal heart sounds.   Pulmonary/Chest: He has wheezes.  Abdominal: Soft. He exhibits no distension.  Musculoskeletal: Normal range of motion.  Neurological: He is alert and oriented to person, place, and time.  Skin: Skin is warm.  Psychiatric: He has a normal mood and affect.    ED Course  Procedures (including critical care time) Labs Review Labs Reviewed - No data to display  Imaging Review Dg Chest 2 View  08/04/2013   CLINICAL DATA:  Chest pain and shortness of breath with cough for 5 days.  EXAM: CHEST  2 VIEW  COMPARISON:  04/09/2009  FINDINGS: Pulmonary hyperinflation. Changes may represent asthma or reactive airways disease. The heart size and mediastinal contours are within normal limits. Both lungs are clear. The visualized skeletal structures are unremarkable.  IMPRESSION: No active cardiopulmonary disease.   Electronically Signed   By: Burman NievesWilliam  Stevens M.D.   On: 08/04/2013 23:49     EKG Interpretation None      MDM   Final diagnoses:  Bronchitis  Asthma attack    Albuterol and atrovent neb,  Prednisone and zithromax here.   Pt given Rx for prednisone taper, zithromax and albuterol inhaler.    Elson AreasLeslie K Neiva Maenza, PA-C 08/05/13 [phone]

## 2013-08-05 NOTE — ED Notes (Signed)
RT at bedside for treatment

## 2013-10-31 ENCOUNTER — Emergency Department (HOSPITAL_COMMUNITY)
Admission: EM | Admit: 2013-10-31 | Discharge: 2013-10-31 | Disposition: A | Discharge status: Home / Self Care | Payer: Self-pay | Attending: Emergency Medicine | Admitting: Emergency Medicine

## 2013-10-31 ENCOUNTER — Encounter (HOSPITAL_COMMUNITY): Payer: Self-pay | Admitting: Emergency Medicine

## 2013-10-31 DIAGNOSIS — Y990 Civilian activity done for income or pay: Secondary | ICD-10-CM | POA: Insufficient documentation

## 2013-10-31 DIAGNOSIS — Y92410 Unspecified street and highway as the place of occurrence of the external cause: Secondary | ICD-10-CM | POA: Insufficient documentation

## 2013-10-31 DIAGNOSIS — S39012A Strain of muscle, fascia and tendon of lower back, initial encounter: Secondary | ICD-10-CM | POA: Insufficient documentation

## 2013-10-31 MED ORDER — CYCLOBENZAPRINE HCL 10 MG PO TABS: 10.0000 mg | ORAL_TABLET | Freq: Two times a day (BID) | ORAL | Status: DC | PRN

## 2013-10-31 MED ORDER — MELOXICAM 15 MG PO TABS: 15.0000 mg | ORAL_TABLET | Freq: Every day | ORAL | Status: DC
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
## 2013-10-31 NOTE — Discharge Instructions (Signed)
Take Mobic as needed for pain. Take Flexeril as needed for muscle spasm. Refer to attached documents for more information.  °

## 2013-11-02 NOTE — ED Provider Notes (Signed)
Medical screening examination/treatment/procedure(s) were performed by non-physician practitioner and as supervising physician I was immediately available for consultation/collaboration.     Geoffery Lyonsouglas Johnston Maddocks, MD 11/02/13 [phone]

## 2013-12-03 ENCOUNTER — Emergency Department (HOSPITAL_COMMUNITY)
Admission: EM | Admit: 2013-12-03 | Discharge: 2013-12-03 | Disposition: A | Discharge status: Home / Self Care | Payer: No Typology Code available for payment source | Attending: Emergency Medicine | Admitting: Emergency Medicine

## 2013-12-03 ENCOUNTER — Encounter (HOSPITAL_COMMUNITY): Payer: Self-pay | Admitting: Emergency Medicine

## 2013-12-03 DIAGNOSIS — R112 Nausea with vomiting, unspecified: Secondary | ICD-10-CM | POA: Insufficient documentation

## 2013-12-03 DIAGNOSIS — S39012A Strain of muscle, fascia and tendon of lower back, initial encounter: Secondary | ICD-10-CM

## 2013-12-03 DIAGNOSIS — Y929 Unspecified place or not applicable: Secondary | ICD-10-CM | POA: Insufficient documentation

## 2013-12-03 DIAGNOSIS — Y939 Activity, unspecified: Secondary | ICD-10-CM | POA: Insufficient documentation

## 2013-12-03 DIAGNOSIS — X58XXXA Exposure to other specified factors, initial encounter: Secondary | ICD-10-CM | POA: Insufficient documentation

## 2013-12-03 DIAGNOSIS — Y998 Other external cause status: Secondary | ICD-10-CM | POA: Insufficient documentation

## 2013-12-03 DIAGNOSIS — R630 Anorexia: Secondary | ICD-10-CM | POA: Insufficient documentation

## 2013-12-03 MED ORDER — IBUPROFEN 800 MG PO TABS: 800.0000 mg | ORAL_TABLET | Freq: Three times a day (TID) | ORAL | Status: AC | PRN

## 2013-12-03 MED ORDER — IBUPROFEN 800 MG PO TABS
800.0000 mg | ORAL_TABLET | Freq: Once | ORAL | Status: AC
  Administered 2013-12-03: 800 mg via ORAL
  Filled 2013-12-03: qty 1

## 2013-12-03 MED ORDER — CYCLOBENZAPRINE HCL 5 MG PO TABS: 5.0000 mg | ORAL_TABLET | Freq: Three times a day (TID) | ORAL | Status: AC | PRN

## 2013-12-03 MED ORDER — CYCLOBENZAPRINE HCL 10 MG PO TABS
5.0000 mg | ORAL_TABLET | Freq: Once | ORAL | Status: AC
  Administered 2013-12-03: 5 mg via ORAL
  Filled 2013-12-03: qty 1

## 2013-12-03 MED ORDER — ONDANSETRON 4 MG PO TBDP
4.0000 mg | ORAL_TABLET | Freq: Once | ORAL | Status: AC
  Administered 2013-12-03: 4 mg via ORAL
  Filled 2013-12-03: qty 1

## 2013-12-03 MED ORDER — TRAMADOL HCL 50 MG PO TABS: 50.0000 mg | ORAL_TABLET | Freq: Four times a day (QID) | ORAL | Status: AC | PRN

## 2013-12-03 NOTE — ED Provider Notes (Signed)
TIME SEEN: This chart was scribed for Peter Massey Andria Head, DO by Murriel HopperAlec Bankhead, ED Scribe. This patient was seen in room APA11/APA11 and the patient's care was started at 8:55 PM.   CHIEF COMPLAINT: back pain  HPI: HPI Comments: Peter Massey is a 23 y.o. male who presents to the Emergency Department complaining of constant lower back pain that radiates into his hips with associated intermittent leg numbness as well as nausea, vomiting, and lack of appetite due to pain that has been present for four days. Pt notes that he vomited 4x today due to pain. 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 No bowel or bladder incontinence. No urinary retention. No numbness currently. No focal weakness. Has had history of back pain in the past. Pt does not have a PCP and has not seen a specialist for his back pain.     ROS: See HPI Constitutional: no fever  Eyes: no drainage  ENT: no runny nose   Cardiovascular:  no chest pain  Resp: no SOB  GI: no vomiting GU: no dysuria Integumentary: no rash  Allergy: no hives  Musculoskeletal: no leg swelling  Neurological: no slurred speech ROS otherwise negative  PAST MEDICAL HISTORY/PAST SURGICAL HISTORY:  History reviewed. No pertinent past medical history.  MEDICATIONS:  Prior to Admission medications   Medication Sig Start Date End Date Taking? Authorizing Provider  cyclobenzaprine (FLEXERIL) 10 MG tablet Take 1 tablet (10 mg total) by mouth 2 (two) times daily as needed for muscle spasms. Patient not taking: Reported on 12/03/2013 10/31/13   Emilia BeckKaitlyn Szekalski, PA-C  meloxicam (MOBIC) 15 MG tablet Take 1 tablet (15 mg total) by mouth daily. Patient not taking: Reported on 12/03/2013 10/31/13   Emilia BeckKaitlyn Szekalski, PA-C    ALLERGIES:  No Known Allergies  SOCIAL HISTORY:  History  Substance Use Topics  . Smoking status: Never Smoker   . Smokeless tobacco: Former NeurosurgeonUser    Types: Snuff     Quit date: 01/06/2011  . Alcohol Use: Yes     Comment: occasional    FAMILY HISTORY: Family History  Problem Relation Age of Onset  . Stroke Other   . Bipolar disorder Mother     EXAM: BP 127/68 mmHg  Pulse 90  Temp(Src) 98.9 F (37.2 C) (Oral)  Resp 24  Ht 5\' 9"  (1.753 m)  Wt 130 lb (58.968 kg)  BMI 19.19 kg/m2  SpO2 100% CONSTITUTIONAL: Alert and oriented and responds appropriately to questions. Well-appearing; well-nourished HEAD: Normocephalic EYES: Conjunctivae clear, PERRL ENT: normal nose; no rhinorrhea; moist mucous membranes; pharynx without lesions noted NECK: Supple, no meningismus, no LAD  CARD: RRR; S1 and S2 appreciated; no murmurs, no clicks, no rubs, no gallops RESP: Normal chest excursion without splinting or tachypnea; breath sounds clear and equal bilaterally; no wheezes, no rhonchi, no rales,  ABD/GI: Normal bowel sounds; non-distended; soft, non-tender, no rebound, no guarding BACK:  The back appears normal and is non-tender to palpation, there is no CVA tenderness; no midline spinal tenderness; no step-off; no deformities  EXT: Normal ROM in all joints; non-tender to palpation; no edema; normal capillary refill; no cyanosis    SKIN: Normal color for age and race; warm NEURO: Moves all extremities equally; 5/5 strength in all 4 extremities; normal gait; 2+ bilateral patellar reflexes; sensation to light and touch intact diffusely; patient was seen walking down the hallway swinging his child by the arms with  his wife PSYCH: The patient's mood and manner are appropriate. Grooming and personal hygiene are appropriate.   MEDICAL DECISION MAKING: Pt here with complaints of lumbar strain. He is neurologically intact. We'll discharge with tramadol, Flexeril, Advil. Have discussed with patient he needs to follow up with a PCP for further evaluation. I do not feel he needs emergent imaging. He verbalizes understanding. Discussed return precautions.     I  personally performed the services described in this documentation, which was scribed in my presence. The recorded information has been reviewed and is accurate.    Peter Massey Remona Boom, DO 12/03/13 2148

## 2013-12-03 NOTE — ED Notes (Signed)
Patient c/o lower back pain that radiates into hips since Thursday. Patient reports nausea, vomiting, and lack of appetite due to pain. Per patient vomited x4 today. 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

## 2013-12-03 NOTE — Discharge Instructions (Signed)
°Lumbosacral Strain °Lumbosacral strain is a strain of any of the parts that make up your lumbosacral vertebrae. Your lumbosacral vertebrae are the bones that make up the lower third of your backbone. Your lumbosacral vertebrae are held together by muscles and tough, fibrous tissue (ligaments).  °CAUSES  °A sudden blow to your back can cause lumbosacral strain. Also, anything that causes an excessive stretch of the muscles in the low back can cause this strain. This is typically seen when people exert themselves strenuously, fall, lift heavy objects, bend, or crouch repeatedly. °RISK FACTORS °· Physically demanding work. °· Participation in pushing or pulling sports or sports that require a sudden twist of the back (tennis, golf, baseball). °· Weight lifting. °· Excessive lower back curvature. °· Forward-tilted pelvis. °· Weak back or abdominal muscles or both. °· Tight hamstrings. °SIGNS AND SYMPTOMS  °Lumbosacral strain may cause pain in the area of your injury or pain that moves (radiates) down your leg.  °DIAGNOSIS °Your health care provider can often diagnose lumbosacral strain through a physical exam. In some cases, you may need tests such as X-ray exams.  °TREATMENT  °Treatment for your lower back injury depends on many factors that your clinician will have to evaluate. However, most treatment will include the use of anti-inflammatory medicines. °HOME CARE INSTRUCTIONS  °· Avoid hard physical activities (tennis, racquetball, waterskiing) if you are not in proper physical condition for it. This may aggravate or create problems. °· If you have a back problem, avoid sports requiring sudden body movements. Swimming and walking are generally safer activities. °· Maintain good posture. °· Maintain a healthy weight. °· For acute conditions, you may put ice on the injured area. °¨ Put ice in a plastic bag. °¨ Place a towel between your skin and the bag. °¨ Leave the ice on for 20 minutes, 2-3 times a day. °· When  the low back starts healing, stretching and strengthening exercises may be recommended. °SEEK MEDICAL CARE IF: °· Your back pain is getting worse. °· You experience severe back pain not relieved with medicines. °SEEK IMMEDIATE MEDICAL CARE IF:  °· You have numbness, tingling, weakness, or problems with the use of your arms or legs. °· There is a change in bowel or bladder control. °· You have increasing pain in any area of the body, including your belly (abdomen). °· You notice shortness of breath, dizziness, or feel faint. °· You feel sick to your stomach (nauseous), are throwing up (vomiting), or become sweaty. °· You notice discoloration of your toes or legs, or your feet get very cold. °MAKE SURE YOU:  °· Understand these instructions. °· Will watch your condition. °· Will get help right away if you are not doing well or get worse. °Document Released: 10/01/2004 Document Revised: 12/27/2012 Document Reviewed: 08/10/2012 °ExitCare® Patient Information ©2015 [employer]. This information is not intended to replace advice given to you by your health care provider. Make sure you discuss any questions you have with your health care provider. ° °  Emergency Department Resource Guide °1) Find a Doctor and Pay Out of Pocket °Although you won't have to find out who is covered by your insurance plan, it is a good idea to ask around and get recommendations. You will then need to call the office and see if the doctor you have chosen will accept you as a new patient and what types of options they offer for patients who are self-pay. Some doctors offer discounts or will set up payment plans   for their patients who do not have insurance, but you will need to ask so you aren't surprised when you get to your appointment. ° °2) Contact Your Local Health Department °Not all health departments have doctors that can see patients for sick visits, but many do, so it is worth a call to see if yours does. If you don't know where your  local health department is, you can check in your phone book. The CDC also has a tool to help you locate your state's health department, and many state websites also have listings of all of their local health departments. ° °3) Find a Walk-in Clinic °If your illness is not likely to be very severe or complicated, you may want to try a walk in clinic. These are popping up all over the country in pharmacies, drugstores, and shopping centers. They're usually staffed by nurse practitioners or physician assistants that have been trained to treat common illnesses and complaints. They're usually fairly quick and inexpensive. However, if you have serious medical issues or chronic medical problems, these are probably not your best option. ° °No Primary Care Doctor: °- Call [facility] at  832-8000 - they can help you locate a primary care doctor that  accepts your insurance, provides certain services, etc. °- Physician Referral Service- 1-800-533-3463 ° °Chronic Pain Problems: °Organization         Address  Phone   Notes  °[facility]  (336) 297-2271 Patients need to be referred by their primary care doctor.  ° °Medication Assistance: °Organization         Address  Phone   Notes  °[place] [facility] [address] °[place], [place] [address] (336) 641-8030 --Must be a resident of [place] °-- Must have NO insurance coverage whatsoever (no Medicaid/ Medicare, etc.) °-- The pt. MUST have a primary care doctor that directs their care regularly and follows them in the community °  °[facility]  (866) 331-1348   °[employer]  (888) 892-1162   ° °Agencies that provide inexpensive medical care: °Organization         Address  Phone   Notes  °[facility]  (336) 832-8035   °[facility]    (336) 832-7272   °[facility] [address] °[place], [place] [address] (336) 832-4777   °[facility] [address], °[place] (336) 271-4999   °[facility]    (336) 373-0678   °[facility]    (336) 272-1050   °[facility] ° [address], [place] Phone:  (336) 832-4444, Fax:  (336) 832-4440 Hours of Operation:  9 am - 6 pm, M-F.  Also accepts Medicaid/Medicare and self-pay.  °[facility] ° [address], [place] Phone: (336) 832-3150, Fax: (336) 832-3151. Hours of Operation:  8:30 am - 5:30 pm, M-F.  Also accepts Medicaid and self-pay.  °[facility] [place] [address], [place] Phone: (336) 878-6027   °[facility] [address], [place],  (336)723-1848, Ext. 123 Mondays & Thursdays: 7-9 AM.  First 15 patients are seen on a first come, first serve basis. °  ° °Medicaid-accepting [place] Providers: ° °Organization         Address  Phone   Notes  °[facility] [address], [place] (336) 641-2100 Also accepts self-pay patients.  °  [facility] [address], [place] ° (336) 856-9996   °[facility] [address], [place] (336) 288-8857   °[facility] [address] [place] [address], [place] (336) 299-7000   °Veita Bland [address], [place]  ° (336) 373-1557 Only accepts [place] Access Medicaid patients after they have their name applied to their card.  ° °Self-Pay (no insurance) in [place]: ° °Organization         Address  Phone   Notes  °Sickle Cell Patients, [facility] [address], [place] (336) 832-1970   °[facility] [address], [place] (336) 832-4400   °[facility] [place] ° [address], [place] (336) 992-4800   °[facility]/Dr. Osei-Bonsu ° [address] [place] [address], [place] or [address], [place] (336) [phone] Phone number for both [place] and [place] locations is the same.  °[facility] [address], [place] (336) 299-0000   °[facility] [address] [place] [address], [place] or [address] (336) 852-7530 °(336) 878-2260   °[facility] [address], [place] (336) 350-1642, phone; (336) 294-5005, fax Sees patients 1st and 3rd Saturday of every month.  Must not qualify for public or private insurance (i.e. Medicaid, Medicare, [place] Health Choice, Veterans' Benefits) • Household income should be no more than 200% of the poverty level •The clinic cannot treat you if you are pregnant or think you are pregnant • Sexually transmitted diseases are not treated at the clinic.  ° °Dental Care: °Organization         Address  Phone  Notes  °[facility] [facility] [address], [place] (336) 641-6152 Accepts children up to age 21 who are enrolled in Medicaid or [place] Health Choice; pregnant women with a Medicaid card; and children who have applied for Medicaid or [place] Health Choice, but were declined, whose parents can pay a reduced fee at time of service.  °[facility] [place]  [address], [place] (336) 641-7733 Accepts children up to age 21 who are enrolled in Medicaid or [place] Health Choice; pregnant women with a Medicaid card; and children who have applied for Medicaid or [place] Health Choice, but were declined, whose parents can pay a reduced fee at time of service.  °[facility] ° [address], [place] (336) 641-4533 Patients are seen by appointment only. Walk-ins are not accepted. [facility] will see patients 18 years of age and older. °Monday - Tuesday (8am-5pm) °Most Wednesdays (8:30-5pm) °$30 per visit, cash only  °[facility] ° [address], [place] (336) 641-4533 Patients are seen by appointment only. Walk-ins are not  accepted. [facility] will see patients 18 years of age and older. °One Wednesday Evening (Monthly: Volunteer Based).  $30 per visit, cash only  °[facility]  (919) 537-3737 for adults; Children under age 4, call Graduate Pediatric Dentistry at (919) [phone]. Children aged 4-14, please call (919) [phone] to request a pediatric application. ° Dental services are provided in all areas of dental care including fillings, crowns and bridges, complete and partial dentures, implants, gum treatment, root canals, and extractions. Preventive care is also provided. Treatment is provided to both adults and   children. °Patients are selected via a lottery and there is often a waiting list. °  °[facility] [address], °[place] ° (336) 763-8833 [email or web address] °  °[facility] [address], [place], [place] (336)723-1848, Ext. 123 Second and Fourth Thursday of each month, opens at 6:30 AM; Clinic ends at 9 AM.  Patients are seen on a first-come first-served basis, and a limited number are seen during each clinic.  ° °[facility] ° [address], [place], [place] (336) 723-7904   Eligibility Requirements °You must have lived in [place], or [place] counties for at least the last three months. °  You cannot be eligible for state or federal sponsored healthcare insurance, including Veterans Administration, Medicaid, or Medicare. °  You generally cannot be eligible for healthcare insurance through your employer.  °  How to apply: °Eligibility screenings are held every Tuesday and Wednesday afternoon from 1:00 pm until 4:00 pm. You do not need an appointment for the interview!  °[facility] [address], [place], [place] 336-631-2330   °[facility] [facility]  336-342-8273   °[facility]  336-703-3100   °[facility]  336-570-6415   ° °Behavioral Health Resources in the  Community: °Intensive Outpatient Programs °Organization         Address  Phone  Notes  °[facility] [address], [place], [place] 336-878-6098   °[facility] [address], [place], [place] 336-832-9800   °[facility] [address], [place], [place] ° 336-882-2125   °[facility] [address],  °[place], [place] 1-800-853-5163 or 336-641-4981   °Substance Abuse Resources °Organization         Address  Phone  Notes  °[facility]  336-882-2125   °[facility]  336-784-9470   °[facility]  336-285-9073   °[facility]  336-845-3988   °Residential & Outpatient Substance Abuse Program  1-800-659-3381   °Psychological Services °Organization         Address  Phone  Notes  °[facility]  336- 832-9600   °[facility]  336- 378-7881   °[facility] [address], [place] 1-800-853-5163 or 336-641-4981   ° °Mobile Crisis Teams °Organization         Address  Phone  Notes  °[facility], Mobile Crisis Care Unit  1-877-626-1772   °Assertive °[facility] ° [address] [place], [place] 336-834-9664   °Sharon DeEsch [address] °[place] 336-554-5454   ° °Self-Help/Support Groups °Organization         Address  Phone             Notes  °[facility] [place] - variety of support groups  336- 373-1402 Call for more information  °Narcotics Anonymous (NA), [facility] [address], °[place]  2 meetings at this location  ° °Residential Treatment Programs °Organization         Address  Phone  Notes  °[facility] [address],    °[place]  1-866-801-8205   °[facility] ° [address], [place], [place] 704-293-8524   °[facility] [address], [place] 336-845-3988 Admissions: 8am-3pm M-F  °[facility] [address]    °[place], [place] 336-841-1104   °[facility] [address], [place], [place] 336-379-7146   °[facility] [address]  °[place], [place] 336-285-9073   °[facility] - Intensive Outpatient [address], [place], [place] 336-852-3033   °[facility] [address]  °[place], [place] 1-877-615-2722 or 336-784-9470   °[facility] [address] [place], [place] 336-227-7417 Accepts Medicaid  °[facility] [address]  °[place] 1-800-659-3381 Substance Abuse/Addiction Treatment  ° °[place] Behavioral Health Resources °Organization         Address  Phone  Notes  °[facility]  (888) 581-9988   °Julie Brannon, PhD [address] [place], [place]   (336) 349-5553 or (336) 951-0000   °[facility]   [address] °[place], [place] (336) 349-4454   °[facility] [address], [place], [place] (336) 342-8316 Insurance/Medicaid/sponsorship through [facility]  °[facility] [address]                                    [place], [place] (336) 342-8316 Therapy/tele-psych/case  °[facility] [address]  ° [place], [place] (336) 349-2233    °Dr. Arfeen  (336) 349-4544   °[facility] [place]  [employer] [place] [facility] 1) [address], [place] °2) [address], [place] °3)  [address], [place] (336) 349-3220 °(336) 342-7768 ° °(336) 342-8140   °[place] [facility] (336) 342-1394 or (336) 342-3537 (After Hours)    ° °   °

## 2013-12-05 ENCOUNTER — Emergency Department (HOSPITAL_COMMUNITY)
Admission: EM | Admit: 2013-12-05 | Discharge: 2013-12-05 | Disposition: A | Discharge status: Home / Self Care | Payer: No Typology Code available for payment source | Attending: Emergency Medicine | Admitting: Emergency Medicine

## 2013-12-05 ENCOUNTER — Encounter (HOSPITAL_COMMUNITY): Payer: Self-pay | Admitting: Emergency Medicine

## 2013-12-05 DIAGNOSIS — Z8659 Personal history of other mental and behavioral disorders: Secondary | ICD-10-CM | POA: Insufficient documentation

## 2013-12-05 DIAGNOSIS — J029 Acute pharyngitis, unspecified: Secondary | ICD-10-CM | POA: Insufficient documentation

## 2013-12-05 DIAGNOSIS — Z8673 Personal history of transient ischemic attack (TIA), and cerebral infarction without residual deficits: Secondary | ICD-10-CM | POA: Insufficient documentation

## 2013-12-05 DIAGNOSIS — Z79899 Other long term (current) drug therapy: Secondary | ICD-10-CM | POA: Insufficient documentation

## 2013-12-05 LAB — RAPID STREP SCREEN (MED CTR MEBANE ONLY): STREPTOCOCCUS, GROUP A SCREEN (DIRECT): NEGATIVE

## 2013-12-05 NOTE — ED Provider Notes (Signed)
CSN: [id number]     Arrival date & time 12/05/13  1021 History   First MD Initiated Contact with Patient 12/05/13 1108     Chief Complaint  Patient presents with  . Sore Throat     (Consider location/radiation/quality/duration/timing/severity/associated sxs/prior Treatment) Patient is a 23 y.o. male presenting with pharyngitis. The history is provided by the patient. No language interpreter was used.  Sore Throat This is a new problem. The current episode started yesterday. The problem occurs constantly. The problem has been unchanged. Associated symptoms include chills, a fever and a sore throat. Pertinent negatives include no neck pain or rash. The symptoms are aggravated by swallowing. He has tried nothing for the symptoms.    History reviewed. No pertinent past medical history. History reviewed. No pertinent past surgical history. Family History  Problem Relation Age of Onset  . Stroke Other   . Bipolar disorder Mother    History  Substance Use Topics  . Smoking status: Never Smoker   . Smokeless tobacco: Former NeurosurgeonUser    Types: Snuff    Quit date: 01/06/2011  . Alcohol Use: Yes     Comment: occasional    Review of Systems  Constitutional: Positive for fever and chills.  HENT: Positive for sore throat.   Musculoskeletal: Negative for neck pain.  Skin: Negative for rash.  All other systems reviewed and are negative.     Allergies  Review of patient's allergies indicates no known allergies.  Home Medications   Prior to Admission medications   Medication Sig Start Date End Date Taking? Authorizing Provider  cyclobenzaprine (FLEXERIL) 5 MG tablet Take 1 tablet (5 mg total) by mouth 3 (three) times daily as needed for muscle spasms. 12/03/13   Kristen N Ward, DO  ibuprofen (ADVIL,MOTRIN) 800 MG tablet Take 1 tablet (800 mg total) by mouth every 8 (eight) hours as needed for mild pain. 12/03/13   Kristen N Ward, DO  traMADol (ULTRAM) 50 MG tablet Take 1 tablet (50 mg  total) by mouth every 6 (six) hours as needed. 12/03/13   Kristen N Ward, DO   BP 140/78 mmHg  Pulse 87  Temp(Src) 98.8 F (37.1 C) (Oral)  Resp 18  Ht 5\' 9"  (1.753 m)  Wt 130 lb (58.968 kg)  BMI 19.19 kg/m2  SpO2 100% Physical Exam  Constitutional: He is oriented to person, place, and time. He appears well-developed and well-nourished.  HENT:  Right Ear: External ear normal.  Left Ear: External ear normal.  Mouth/Throat: Posterior oropharyngeal erythema present.  Eyes: Conjunctivae and EOM are normal. Pupils are equal, round, and reactive to light.  Cardiovascular: Normal rate and regular rhythm.   Pulmonary/Chest: Effort normal and breath sounds normal.  Musculoskeletal: Normal range of motion.  Neurological: He is alert and oriented to person, place, and time.  Nursing note and vitals reviewed.   ED Course  Procedures (including critical care time) Labs Review Labs Reviewed  RAPID STREP SCREEN  CULTURE, GROUP A STREP    Imaging Review No results found.   EKG Interpretation None      MDM   Final diagnoses:  Pharyngitis    Strep negative. Discussed symptomatic treatment at home    Teressa LowerVrinda Jakita Dutkiewicz, NP 12/05/13 1208  Benny LennertJoseph L Zammit, MD 12/05/13 [phone]

## 2013-12-05 NOTE — ED Notes (Signed)
Pt reports sore throat x2 days. Pt denies any known fevers. Airway patent. nad noted.

## 2013-12-05 NOTE — Discharge Instructions (Signed)

## 2013-12-07 LAB — CULTURE, GROUP A STREP

## 2015-01-04 ENCOUNTER — Emergency Department (HOSPITAL_COMMUNITY)
Admission: EM | Admit: 2015-01-04 | Discharge: 2015-01-04 | Disposition: A | Discharge status: Home / Self Care | Payer: Self-pay | Attending: Emergency Medicine | Admitting: Emergency Medicine

## 2015-01-04 ENCOUNTER — Encounter (HOSPITAL_COMMUNITY): Payer: Self-pay | Admitting: Emergency Medicine

## 2015-01-04 DIAGNOSIS — R45851 Suicidal ideations: Secondary | ICD-10-CM | POA: Insufficient documentation

## 2015-01-04 DIAGNOSIS — Z008 Encounter for other general examination: Secondary | ICD-10-CM | POA: Insufficient documentation

## 2015-01-04 DIAGNOSIS — F1210 Cannabis abuse, uncomplicated: Secondary | ICD-10-CM | POA: Insufficient documentation

## 2015-01-04 DIAGNOSIS — F1310 Sedative, hypnotic or anxiolytic abuse, uncomplicated: Secondary | ICD-10-CM | POA: Insufficient documentation

## 2015-01-04 DIAGNOSIS — F410 Panic disorder [episodic paroxysmal anxiety] without agoraphobia: Secondary | ICD-10-CM | POA: Insufficient documentation

## 2015-01-04 LAB — DIFFERENTIAL
Basophils Absolute: 0 10*3/uL (ref 0.0–0.1)
Basophils Relative: 1 %
EOS PCT: 1 %
Eosinophils Absolute: 0 10*3/uL (ref 0.0–0.7)
LYMPHS ABS: 2.3 10*3/uL (ref 0.7–4.0)
Lymphocytes Relative: 35 %
MONO ABS: 0.6 10*3/uL (ref 0.1–1.0)
Monocytes Relative: 9 %
NEUTROS PCT: 54 %
Neutro Abs: 3.6 10*3/uL (ref 1.7–7.7)

## 2015-01-04 LAB — COMPREHENSIVE METABOLIC PANEL
ALBUMIN: 4.9 g/dL (ref 3.5–5.0)
ALT: 15 U/L — ABNORMAL LOW (ref 17–63)
AST: 17 U/L (ref 15–41)
Alkaline Phosphatase: 69 U/L (ref 38–126)
Anion gap: 10 (ref 5–15)
BUN: 9 mg/dL (ref 6–20)
CHLORIDE: 106 mmol/L (ref 101–111)
CO2: 25 mmol/L (ref 22–32)
Calcium: 9.8 mg/dL (ref 8.9–10.3)
Creatinine, Ser: 0.84 mg/dL (ref 0.61–1.24)
GFR calc non Af Amer: >60 mL/min (ref >60)
Glucose, Bld: 89 mg/dL (ref 65–99)
POTASSIUM: 3.9 mmol/L (ref 3.5–5.1)
Sodium: 141 mmol/L (ref 135–145)
Total Bilirubin: 1.1 mg/dL (ref 0.3–1.2)
Total Protein: 7.9 g/dL (ref 6.5–8.1)

## 2015-01-04 LAB — CBC
HCT: 42.6 % (ref 39.0–52.0)
Hemoglobin: 14.9 g/dL (ref 13.0–17.0)
MCH: 32.7 pg (ref 26.0–34.0)
MCHC: 35 g/dL (ref 30.0–36.0)
MCV: 93.6 fL (ref 78.0–100.0)
PLATELETS: 314 10*3/uL (ref 150–400)
RBC: 4.55 MIL/uL (ref 4.22–5.81)
RDW: 11.6 % (ref 11.5–15.5)
WBC: 6.9 10*3/uL (ref 4.0–10.5)

## 2015-01-04 LAB — RAPID URINE DRUG SCREEN, HOSP PERFORMED
AMPHETAMINES: NOT DETECTED
BENZODIAZEPINES: POSITIVE — AB
Barbiturates: NOT DETECTED
Cocaine: NOT DETECTED
OPIATES: NOT DETECTED
Tetrahydrocannabinol: POSITIVE — AB

## 2015-01-04 LAB — ACETAMINOPHEN LEVEL

## 2015-01-04 LAB — TSH: TSH: 0.228 u[IU]/mL — ABNORMAL LOW (ref 0.350–4.500)

## 2015-01-04 LAB — SALICYLATE LEVEL

## 2015-01-04 LAB — ETHANOL

## 2015-01-04 NOTE — ED Notes (Signed)
Pt states he's going to [facility] for drug rehab and suicidal thoughts and needs to be medically cleared before they'll accept him there.

## 2015-01-04 NOTE — Discharge Instructions (Signed)
You are medically cleared to go to your detox/rehab facility.     Medical Screening Exam A medical screening exam has been done. This exam helps find the cause of your problem and determines whether you need emergency treatment. Your exam has shown that you do not need emergency treatment at this point. It is safe for you to go to your caregiver's office or clinic for treatment. You should make an appointment today to see your caregiver as soon as he or she is available. Depending on your illness, your symptoms and condition can change over time. If your condition gets worse or you develop new or troubling symptoms before you see your caregiver, you should return to the emergency department for further evaluation.    This information is not intended to replace advice given to you by your health care provider. Make sure you discuss any questions you have with your health care provider.   Document Released: 01/30/2004 Document Revised: 01/12/2014 Document Reviewed: 09/10/2010 Elsevier Interactive Patient Education [employer]2016 Elsevier Inc.

## 2015-01-04 NOTE — ED Provider Notes (Signed)
CSN: [id number]     Arrival date & time 01/04/15  1518 History   First MD Initiated Contact with Patient 01/04/15 1630     Chief Complaint  Patient presents with  . Medical Clearance     (Consider location/radiation/quality/duration/timing/severity/associated sxs/prior Treatment) HPI   24 year old male who presents for medical clearance. Otherwise healthy. Currently on probation, and history of opiate and marijuana abuse. States detox was offered to him and he wanted to turn his life around. Having increased stressors recently with increased panic attacks and suicidal thoughts without a plan. No HI, VAH. Has been in usual state of health otherwise. No recent illness and no complaints. Accepted to [facility] for drug detox and sent to ED for medical clearance before treatment.  History reviewed. No pertinent past medical history. History reviewed. No pertinent past surgical history. Family History  Problem Relation Age of Onset  . Stroke Other   . Bipolar disorder Mother    Social History  Substance Use Topics  . Smoking status: Never Smoker   . Smokeless tobacco: Former NeurosurgeonUser    Types: Snuff    Quit date: 01/06/2011  . Alcohol Use: Yes     Comment: occasional    Review of Systems 10/14 systems reviewed and are negative other than those stated in the HPI    Allergies  Ibuprofen  Home Medications   Prior to Admission medications   Medication Sig Start Date End Date Taking? Authorizing Provider  cyclobenzaprine (FLEXERIL) 5 MG tablet Take 1 tablet (5 mg total) by mouth 3 (three) times daily as needed for muscle spasms. Patient not taking: Reported on 01/04/2015 12/03/13   Kristen N Ward, DO  ibuprofen (ADVIL,MOTRIN) 800 MG tablet Take 1 tablet (800 mg total) by mouth every 8 (eight) hours as needed for mild pain. Patient not taking: Reported on 01/04/2015 12/03/13   Kristen N Ward, DO  traMADol (ULTRAM) 50 MG tablet Take 1 tablet (50 mg total) by mouth every 6 (six) hours as  needed. Patient not taking: Reported on 01/04/2015 12/03/13   Kristen N Ward, DO   BP 132/73 mmHg  Pulse 77  Temp(Src) 98.1 F (36.7 C) (Oral)  Resp 16  SpO2 100% Physical Exam Physical Exam  Nursing note and vitals reviewed. Constitutional: Well developed, well nourished, non-toxic, and in no acute distress Head: Normocephalic and atraumatic.  Mouth/Throat: Oropharynx is clear and moist.  Neck: Normal range of motion. Neck supple.  Cardiovascular: Normal rate and regular rhythm.   Pulmonary/Chest: Effort normal and breath sounds normal.  Abdominal: Soft. There is no tenderness. There is no rebound and no guarding.  Musculoskeletal: Normal range of motion.  Neurological: Alert, no facial droop, fluent speech, moves all extremities symmetrically Skin: Skin is warm and dry.  Psychiatric: Cooperative  ED Course  Procedures (including critical care time) Labs Review Labs Reviewed  COMPREHENSIVE METABOLIC PANEL - Abnormal; Notable for the following:    ALT 15 (*)    All other components within normal limits  ACETAMINOPHEN LEVEL - Abnormal; Notable for the following:    Acetaminophen (Tylenol), Serum <10 (*)    All other components within normal limits  URINE RAPID DRUG SCREEN, HOSP PERFORMED - Abnormal; Notable for the following:    Benzodiazepines POSITIVE (*)    Tetrahydrocannabinol POSITIVE (*)    All other components within normal limits  ETHANOL  SALICYLATE LEVEL  CBC  DIFFERENTIAL  TSH    Imaging Review No results found. I have personally reviewed and evaluated these  images and lab results as part of my medical decision-making.   EKG Interpretation None      MDM   Final diagnoses:  Medical clearance for psychiatric admission    24 year old male who presents for medical clearance for inpatient drug detox treatment. Is well-appearing and in no acute distress. Vital signs are non-concerning and exam is unremarkable. Blood work overall unremarkable. Tox screen  positive for marijuana and benzodiazepine, which she admits to using earlier this week. It is felt to be medically cleared for treatment at his facility. Discharged in stable condition    Lavera Guise, MD 01/04/15 [phone]

## 2018-07-16 ENCOUNTER — Emergency Department (HOSPITAL_COMMUNITY): Payer: Self-pay

## 2018-07-16 ENCOUNTER — Emergency Department (HOSPITAL_COMMUNITY)
Admission: EM | Admit: 2018-07-16 | Discharge: 2018-07-16 | Disposition: A | Discharge status: Home / Self Care | Payer: Self-pay | Attending: Emergency Medicine | Admitting: Emergency Medicine

## 2018-07-16 ENCOUNTER — Encounter (HOSPITAL_COMMUNITY): Payer: Self-pay

## 2018-07-16 ENCOUNTER — Other Ambulatory Visit: Payer: Self-pay

## 2018-07-16 DIAGNOSIS — F419 Anxiety disorder, unspecified: Secondary | ICD-10-CM | POA: Insufficient documentation

## 2018-07-16 DIAGNOSIS — Z87891 Personal history of nicotine dependence: Secondary | ICD-10-CM | POA: Insufficient documentation

## 2018-07-16 DIAGNOSIS — J40 Bronchitis, not specified as acute or chronic: Secondary | ICD-10-CM | POA: Insufficient documentation

## 2018-07-16 LAB — BASIC METABOLIC PANEL
Anion gap: 10 (ref 5–15)
BUN: 8 mg/dL (ref 6–20)
CO2: 24 mmol/L (ref 22–32)
Calcium: 8.7 mg/dL — ABNORMAL LOW (ref 8.9–10.3)
Chloride: 104 mmol/L (ref 98–111)
Creatinine, Ser: 1 mg/dL (ref 0.61–1.24)
GFR calc Af Amer: >60 mL/min (ref >60)
GFR calc non Af Amer: >60 mL/min (ref >60)
Glucose, Bld: 82 mg/dL (ref 70–99)
Potassium: 4.1 mmol/L (ref 3.5–5.1)
Sodium: 138 mmol/L (ref 135–145)

## 2018-07-16 LAB — CBC WITH DIFFERENTIAL/PLATELET
Abs Immature Granulocytes: 0.02 10*3/uL (ref 0.00–0.07)
Basophils Absolute: 0.1 10*3/uL (ref 0.0–0.1)
Basophils Relative: 2 %
Eosinophils Absolute: 1 10*3/uL — ABNORMAL HIGH (ref 0.0–0.5)
Eosinophils Relative: 12 %
HCT: 44.3 % (ref 39.0–52.0)
Hemoglobin: 15.3 g/dL (ref 13.0–17.0)
Immature Granulocytes: 0 %
Lymphocytes Relative: 38 %
Lymphs Abs: 3.1 10*3/uL (ref 0.7–4.0)
MCH: 33.6 pg (ref 26.0–34.0)
MCHC: 34.5 g/dL (ref 30.0–36.0)
MCV: 97.4 fL (ref 80.0–100.0)
Monocytes Absolute: 0.8 10*3/uL (ref 0.1–1.0)
Monocytes Relative: 11 %
Neutro Abs: 2.9 10*3/uL (ref 1.7–7.7)
Neutrophils Relative %: 37 %
Platelets: 429 10*3/uL — ABNORMAL HIGH (ref 150–400)
RBC: 4.55 MIL/uL (ref 4.22–5.81)
RDW: 12.2 % (ref 11.5–15.5)
WBC: 8 10*3/uL (ref 4.0–10.5)
nRBC: 0 % (ref 0.0–0.2)

## 2018-07-16 LAB — D-DIMER, QUANTITATIVE (NOT AT ARMC): D-Dimer, Quant: 0.7 ug/mL-FEU — ABNORMAL HIGH (ref 0.00–0.50)

## 2018-07-16 MED ORDER — AEROCHAMBER Z-STAT PLUS/MEDIUM MISC
1.0000 | Freq: Once | Status: AC
  Administered 2018-07-16: 11:00:00 1
  Filled 2018-07-16: qty 1

## 2018-07-16 MED ORDER — SODIUM CHLORIDE (PF) 0.9 % IJ SOLN
INTRAMUSCULAR | Status: AC
  Administered 2018-07-16: 13:00:00
  Filled 2018-07-16: qty 50

## 2018-07-16 MED ORDER — PREDNISONE 20 MG PO TABS: 40.0000 mg | ORAL_TABLET | Freq: Every day | ORAL | 0 refills | Status: DC

## 2018-07-16 MED ORDER — IPRATROPIUM BROMIDE HFA 17 MCG/ACT IN AERS
2.0000 | INHALATION_SPRAY | Freq: Once | RESPIRATORY_TRACT | Status: AC
  Administered 2018-07-16: 2 via RESPIRATORY_TRACT
  Filled 2018-07-16: qty 12.9

## 2018-07-16 MED ORDER — ALBUTEROL SULFATE HFA 108 (90 BASE) MCG/ACT IN AERS
8.0000 | INHALATION_SPRAY | Freq: Once | RESPIRATORY_TRACT | Status: AC
  Administered 2018-07-16: 11:00:00 8 via RESPIRATORY_TRACT
  Filled 2018-07-16: qty 6.7

## 2018-07-16 MED ORDER — AEROCHAMBER PLUS FLO-VU LARGE MISC: 1.0000 | Freq: Once | Status: DC

## 2018-07-16 MED ORDER — IOHEXOL 350 MG/ML SOLN
100.0000 mL | Freq: Once | INTRAVENOUS | Status: AC | PRN
  Administered 2018-07-16: 100 mL via INTRAVENOUS

## 2018-07-16 NOTE — Discharge Instructions (Signed)
Use inhaler as needed for shortness of breath and wheezing Take Prednisone for the next 5 days Take Ibuprofen or Tylenol for chest pain Please return if worsening

## 2018-07-16 NOTE — ED Provider Notes (Addendum)
[facility] DEPT Provider Note   CSN: [id number] Arrival date & time: 07/16/18  7425   History   Chief Complaint Chief Complaint  Patient presents with  . Shortness of Breath    HPI Peter Massey is a 28 y.o. male who presents with shortness of breath and chest pain.  No significant past medical history.  The patient states that over the past couple of days he has had some left-sided chest pain.  Overnight the pain worsened after coughing.  He states there is a "knot" over the left side of his chest is very tender to touch.  Associated with shortness of breath.  He has been coughing up a little bit of blood.  He also has been having some wheezing.  He denies smoking states he has had bronchitis once in his life.  EMS transported him and put him on oxygen his O2 was in the low 90s and they gave him Solu-Medrol.  Patient denies fever, abdominal pain, leg swelling.  He denies any sick contacts. He is a former smoker    HPI  History reviewed. No pertinent past medical history.  There are no active problems to display for this patient.   History reviewed. No pertinent surgical history.      Home Medications    Prior to Admission medications   Medication Sig Start Date End Date Taking? Authorizing Provider  cyclobenzaprine (FLEXERIL) 5 MG tablet Take 1 tablet (5 mg total) by mouth 3 (three) times daily as needed for muscle spasms. Patient not taking: Reported on 01/04/2015 12/03/13   Ward, Delice Bison, DO  ibuprofen (ADVIL,MOTRIN) 800 MG tablet Take 1 tablet (800 mg total) by mouth every 8 (eight) hours as needed for mild pain. Patient not taking: Reported on 01/04/2015 12/03/13   Ward, Delice Bison, DO  traMADol (ULTRAM) 50 MG tablet Take 1 tablet (50 mg total) by mouth every 6 (six) hours as needed. Patient not taking: Reported on 01/04/2015 12/03/13   Ward, Delice Bison, DO    Family History Family History  Problem Relation Age of Onset  . Stroke  Other   . Bipolar disorder Mother     Social History Social History   Tobacco Use  . Smoking status: Former Research scientist (life sciences)  . Smokeless tobacco: Former Systems developer    Types: Snuff    Quit date: 01/06/2011  Substance Use Topics  . Alcohol use: Yes    Comment: occasional  . Drug use: No    Types: Marijuana    Comment: Per patient has not used "in a couple of months" 12/03/2013     Allergies   Ibuprofen   Review of Systems Review of Systems  Constitutional: Negative for fever.  Respiratory: Positive for cough, shortness of breath and wheezing.   Cardiovascular: Positive for chest pain. Negative for leg swelling.  Gastrointestinal: Negative for abdominal pain.  All other systems reviewed and are negative.    Physical Exam Updated Vital Signs BP 131/83 (BP Location: Right Arm)   Pulse 82   Temp (!) 97.4 F (36.3 C) (Oral)   Resp (!) 23   Ht 5\' 10"  (1.778 m)   Wt 59 kg   SpO2 100%   BMI 18.66 kg/m   Physical Exam Vitals signs and nursing note reviewed.  Constitutional:      General: He is not in acute distress.    Appearance: He is well-developed. He is not ill-appearing.     Comments: Anxious, cooperative  HENT:  Head: Normocephalic and atraumatic.  Eyes:     General: No scleral icterus.       Right eye: No discharge.        Left eye: No discharge.     Conjunctiva/sclera: Conjunctivae normal.     Pupils: Pupils are equal, round, and reactive to light.  Neck:     Musculoskeletal: Normal range of motion.  Cardiovascular:     Rate and Rhythm: Normal rate and regular rhythm.  Pulmonary:     Effort: Pulmonary effort is normal. No respiratory distress.     Breath sounds: Wheezing (diffuse inspiratory/expiratory wheezes) present.  Chest:     Chest wall: Tenderness (left sided lower rib tenderness) present.  Abdominal:     General: There is no distension.     Palpations: Abdomen is soft.     Tenderness: There is no abdominal tenderness.  Musculoskeletal:     Right  lower leg: No edema.     Left lower leg: No edema.  Skin:    General: Skin is warm and dry.  Neurological:     Mental Status: He is alert and oriented to person, place, and time.  Psychiatric:        Behavior: Behavior normal.      ED Treatments / Results  Labs (all labs ordered are listed, but only abnormal results are displayed) Labs Reviewed  BASIC METABOLIC PANEL - Abnormal; Notable for the following components:      Result Value   Calcium 8.7 (*)    All other components within normal limits  CBC WITH DIFFERENTIAL/PLATELET - Abnormal; Notable for the following components:   Platelets 429 (*)    Eosinophils Absolute 1.0 (*)    All other components within normal limits  D-DIMER, QUANTITATIVE (NOT AT [facility]) - Abnormal; Notable for the following components:   D-Dimer, Quant 0.70 (*)    All other components within normal limits    EKG None  Radiology Ct Angio Chest Pe W/cm &/or Wo Cm  Result Date: 07/16/2018 CLINICAL DATA:  Left-sided severe chest pain for several days, worse when breathing and laying down. Knot on the chest that is tender to touch. EXAM: CT ANGIOGRAPHY CHEST WITH CONTRAST TECHNIQUE: Multidetector CT imaging of the chest was performed using the standard protocol during bolus administration of intravenous contrast. Multiplanar CT image reconstructions and MIPs were obtained to evaluate the vascular anatomy. CONTRAST:  100mL OMNIPAQUE IOHEXOL 350 MG/ML SOLN COMPARISON:  Current chest radiograph. FINDINGS: Cardiovascular: There is satisfactory opacification of the pulmonary arteries to the segmental level. There is no evidence of a pulmonary embolism. Heart is normal in size and configuration. No pericardial effusion. Great vessels are within normal limits. No aortic dissection. Mediastinum/Nodes: No enlarged mediastinal, hilar, or axillary lymph nodes. Thyroid gland, trachea, and esophagus demonstrate no significant findings. Lungs/Pleura: Minimal scarring at the  apices. Minimal areas of dependent atelectasis. Lungs otherwise clear. No pleural effusion. No pneumothorax. Upper Abdomen: Visualized upper abdominal structures are unremarkable. Musculoskeletal: No chest wall abnormality. No acute or significant osseous findings. Review of the MIP images confirms the above findings. IMPRESSION: 1. No evidence of a pulmonary embolism. 2. Essentially negative exam. Electronically Signed   By: Amie Portlandavid  Ormond M.D.   On: 07/16/2018 11:53   Dg Chest Port 1 View  Result Date: 07/16/2018 CLINICAL DATA:  Shortness of breath and hemoptysis. Reported soft tissue lesion on left chest EXAM: PORTABLE CHEST 1 VIEW COMPARISON:  August 04, 2013 FINDINGS: Lungs are clear. Heart size and pulmonary vascularity  are normal. No adenopathy. No pneumothorax. No evident bone lesions. No soft tissue lesions are appreciable. IMPRESSION: No abnormality noted. Electronically Signed   By: Bretta BangWilliam  Woodruff III M.D.   On: 07/16/2018 10:42    Procedures Procedures (including critical care time)  Medications Ordered in ED Medications  albuterol (VENTOLIN HFA) 108 (90 Base) MCG/ACT inhaler 8 puff (8 puffs Inhalation Given 07/16/18 1109)  ipratropium (ATROVENT HFA) inhaler 2 puff (2 puffs Inhalation Given 07/16/18 1109)  aerochamber Z-Stat Plus/medium 1 each (1 each Other Given 07/16/18 1109)  iohexol (OMNIPAQUE) 350 MG/ML injection 100 mL (100 mLs Intravenous Contrast Given 07/16/18 1140)  sodium chloride (PF) 0.9 % injection (  Contrast Given 07/16/18 1233)     Initial Impression / Assessment and Plan / ED Course  I have reviewed the triage vital signs and the nursing notes.  Pertinent labs & imaging results that were available during my care of the patient were reviewed by me and considered in my medical decision making (see chart for details).  28 year old male presents with SOB, wheezing, coughing, and now L sided chest pain with mild hemoptysis for a couple days. He reports hx of bronchitis  and is wheezing on exam. He is very tender over this chest wall. DDx: asthma/bronchitis, pneumonia, PE, spontaneous pneumothorax. Will obtain EKG, CXR, labs, D-dimer, breathing tx. He already received steroids with EMS. His vitals are normal. He was placed on O2 for comfort by EMS but is not hypoxic.  10:39 AM EKG is SR. CXR is negative. Labs and tx are pending.  11:52 AM CBC and BMP are normal. D-dimer is slightly elevated. Will order CTA of chest. I rechecked pt and he states his breathing is better after beta-agonist tx. He still has mild wheezing on exam but sounds improved.  CTA is negative. Pt still feels better. Will d/c   Final Clinical Impressions(s) / ED Diagnoses   Final diagnoses:  Bronchitis    ED Discharge Orders    None       Bethel BornGekas, Nicolaus Andel Marie, PA-C 07/16/18 1437    Bethel BornGekas, Timonthy Hovater Marie, PA-C 07/16/18 1437    Gerhard MunchLockwood, Robert, MD 07/17/18 [phone]

## 2018-07-16 NOTE — ED Triage Notes (Signed)
Pt reports having a "knot" that has appeared on his left chest that is very tender to touch and painful- has been experiencing SHOB since this knot appeared.

## 2018-08-31 ENCOUNTER — Emergency Department (HOSPITAL_BASED_OUTPATIENT_CLINIC_OR_DEPARTMENT_OTHER)
Admission: EM | Admit: 2018-08-31 | Discharge: 2018-08-31 | Disposition: A | Discharge status: Home / Self Care | Payer: Self-pay | Attending: Emergency Medicine | Admitting: Emergency Medicine

## 2018-08-31 ENCOUNTER — Other Ambulatory Visit: Payer: Self-pay

## 2018-08-31 ENCOUNTER — Encounter (HOSPITAL_BASED_OUTPATIENT_CLINIC_OR_DEPARTMENT_OTHER): Payer: Self-pay | Admitting: Emergency Medicine

## 2018-08-31 DIAGNOSIS — R062 Wheezing: Secondary | ICD-10-CM | POA: Insufficient documentation

## 2018-08-31 DIAGNOSIS — Z886 Allergy status to analgesic agent status: Secondary | ICD-10-CM | POA: Insufficient documentation

## 2018-08-31 DIAGNOSIS — L02416 Cutaneous abscess of left lower limb: Secondary | ICD-10-CM | POA: Insufficient documentation

## 2018-08-31 DIAGNOSIS — Z87891 Personal history of nicotine dependence: Secondary | ICD-10-CM | POA: Insufficient documentation

## 2018-08-31 DIAGNOSIS — L03116 Cellulitis of left lower limb: Secondary | ICD-10-CM | POA: Insufficient documentation

## 2018-08-31 MED ORDER — DOXYCYCLINE HYCLATE 100 MG PO CAPS: 100.0000 mg | ORAL_CAPSULE | Freq: Two times a day (BID) | ORAL | 0 refills | Status: AC

## 2018-08-31 MED ORDER — DEXAMETHASONE 6 MG PO TABS
10.0000 mg | ORAL_TABLET | Freq: Once | ORAL | Status: AC
  Administered 2018-08-31: 10 mg via ORAL
  Filled 2018-08-31: qty 1

## 2018-08-31 MED ORDER — ALBUTEROL SULFATE HFA 108 (90 BASE) MCG/ACT IN AERS
2.0000 | INHALATION_SPRAY | Freq: Once | RESPIRATORY_TRACT | Status: AC
  Administered 2018-08-31: 21:00:00 2 via RESPIRATORY_TRACT
  Filled 2018-08-31: qty 6.7

## 2018-08-31 NOTE — Discharge Instructions (Signed)
Warm compresses at least 4x a day.  Return for rapid spreading redness, fever 

## 2018-08-31 NOTE — ED Triage Notes (Signed)
Patient complains of abscess to left lower extremity; onset 2 days ago; redness and drainage noted. Swelling noted to left lower extremity as well.

## 2018-08-31 NOTE — ED Provider Notes (Signed)
[facility] EMERGENCY DEPARTMENT Provider Note   CSN: [id number] Arrival date & time: 08/31/18  1958     History   Chief Complaint Chief Complaint  Patient presents with  . Abscess    HPI Peter Massey is a 28 y.o. male.     28 yo M with a chief complaint of an abscess to the left lower extremity.  This been going on for the past few days.  Eventually came to ahead and the patient popped it at his house.  He ended up removing the outer portion of the skin with an alcohol swab and expressed the purulent drainage.  He was concerned because he had some redness around the area and thought he may need antibiotics.  Patient also has been complaining of cough and shortness of breath.  States he has a history of recurrent bronchitis.  Used to smoke but quit about a year ago.  Has run out of his inhaler.  The history is provided by the patient.  Abscess Location:  Leg Leg abscess location:  L lower leg Abscess quality: draining, fluctuance, induration, painful, redness and warmth   Red streaking: no   Duration:  2 days Progression:  Partially resolved Pain details:    Quality:  Aching and burning   Severity:  Moderate   Duration:  2 days   Timing:  Constant   Progression:  Worsening Chronicity:  New Relieved by:  Nothing Worsened by:  Nothing Ineffective treatments:  None tried Associated symptoms: no fever, no headaches and no vomiting     History reviewed. No pertinent past medical history.  There are no active problems to display for this patient.   History reviewed. No pertinent surgical history.      Home Medications    Prior to Admission medications   Medication Sig Start Date End Date Taking? Authorizing Provider  cyclobenzaprine (FLEXERIL) 5 MG tablet Take 1 tablet (5 mg total) by mouth 3 (three) times daily as needed for muscle spasms. Patient not taking: Reported on 01/04/2015 12/03/13   Ward, Layla Maw, DO  doxycycline (VIBRAMYCIN) 100 MG  capsule Take 1 capsule (100 mg total) by mouth 2 (two) times daily. One po bid x 7 days 08/31/18   Melene Plan, DO  ibuprofen (ADVIL,MOTRIN) 800 MG tablet Take 1 tablet (800 mg total) by mouth every 8 (eight) hours as needed for mild pain. Patient not taking: Reported on 01/04/2015 12/03/13   Ward, Layla Maw, DO  predniSONE (DELTASONE) 20 MG tablet Take 2 tablets (40 mg total) by mouth daily. 07/16/18   Bethel Born, PA-C  traMADol (ULTRAM) 50 MG tablet Take 1 tablet (50 mg total) by mouth every 6 (six) hours as needed. Patient not taking: Reported on 01/04/2015 12/03/13   Ward, Layla Maw, DO    Family History Family History  Problem Relation Age of Onset  . Stroke Other   . Bipolar disorder Mother     Social History Social History   Tobacco Use  . Smoking status: Former Games developer  . Smokeless tobacco: Former Neurosurgeon    Types: Snuff    Quit date: 01/06/2011  Substance Use Topics  . Alcohol use: Yes    Comment: occasional  . Drug use: No    Types: Marijuana    Comment: Per patient has not used "in a couple of months" 12/03/2013     Allergies   Ibuprofen   Review of Systems Review of Systems  Constitutional: Negative for chills and fever.  HENT: Negative for congestion and facial swelling.   Eyes: Negative for discharge and visual disturbance.  Respiratory: Positive for cough and shortness of breath.   Cardiovascular: Negative for chest pain and palpitations.  Gastrointestinal: Negative for abdominal pain, diarrhea and vomiting.  Musculoskeletal: Negative for arthralgias and myalgias.  Skin: Positive for wound. Negative for color change and rash.  Neurological: Negative for tremors, syncope and headaches.  Psychiatric/Behavioral: Negative for confusion and dysphoric mood.     Physical Exam Updated Vital Signs BP 127/87 (BP Location: Right Arm)   Pulse 96   Temp 97.9 F (36.6 C) (Oral)   Resp 20   Ht 5\' 9"  (1.753 m)   Wt 59 kg   SpO2 96%   BMI 19.21 kg/m    Physical Exam Vitals signs and nursing note reviewed.  Constitutional:      Appearance: He is well-developed.  HENT:     Head: Normocephalic and atraumatic.  Eyes:     Pupils: Pupils are equal, round, and reactive to light.  Neck:     Musculoskeletal: Normal range of motion and neck supple.     Vascular: No JVD.  Cardiovascular:     Rate and Rhythm: Normal rate and regular rhythm.     Heart sounds: No murmur. No friction rub. No gallop.   Pulmonary:     Effort: No respiratory distress.     Breath sounds: Wheezing present.     Comments: Coarse breath sounds in all lung fields.  Prolonged expiratory effort. Abdominal:     General: There is no distension.     Tenderness: There is no guarding or rebound.  Musculoskeletal: Normal range of motion.     Comments: Ulceration to the left lateral leg.  About the size of a dime.  He has some extending induration.  No fluctuance.  Skin:    Coloration: Skin is not pale.     Findings: No rash.  Neurological:     Mental Status: He is alert and oriented to person, place, and time.  Psychiatric:        Behavior: Behavior normal.      ED Treatments / Results  Labs (all labs ordered are listed, but only abnormal results are displayed) Labs Reviewed - No data to display  EKG None  Radiology No results found.  Procedures Procedures (including critical care time)  Medications Ordered in ED Medications  dexamethasone (DECADRON) tablet 10 mg (10 mg Oral Given 08/31/18 2055)  albuterol (VENTOLIN HFA) 108 (90 Base) MCG/ACT inhaler 2 puff (2 puffs Inhalation Given 08/31/18 2057)     Initial Impression / Assessment and Plan / ED Course  I have reviewed the triage vital signs and the nursing notes.  Pertinent labs & imaging results that were available during my care of the patient were reviewed by me and considered in my medical decision making (see chart for details).        28 yo M with a chief complaints of abscess.  Going on for  the past couple days.  Patient lanced the abscess himself at home.  Will start on antibiotics.  Warm compresses at home.  Patient is also complaining of cough and shortness of breath.  He does have wheezing in all lung fields.  Will give 2 puffs of an albuterol inhaler dose of Decadron and discharge him home.  9:19 PM:  I have discussed the diagnosis/risks/treatment options with the patient and family and believe the pt to be eligible for discharge home to follow-up  with PCP. We also discussed returning to the ED immediately if new or worsening sx occur. We discussed the sx which are most concerning (e.g., sudden worsening pain, fever, inability to tolerate by mouth, rapid spreading redness) that necessitate immediate return. Medications administered to the patient during their visit and any new prescriptions provided to the patient are listed below.  Medications given during this visit Medications  dexamethasone (DECADRON) tablet 10 mg (10 mg Oral Given 08/31/18 2055)  albuterol (VENTOLIN HFA) 108 (90 Base) MCG/ACT inhaler 2 puff (2 puffs Inhalation Given 08/31/18 2057)     The patient appears reasonably screen and/or stabilized for discharge and I doubt any other medical condition or other [facility] requiring further screening, evaluation, or treatment in the ED at this time prior to discharge.    Final Clinical Impressions(s) / ED Diagnoses   Final diagnoses:  Cellulitis and abscess of left leg    ED Discharge Orders         Ordered    doxycycline (VIBRAMYCIN) 100 MG capsule  2 times daily     08/31/18 2049           Deno Etienne, DO 08/31/18 2119

## 2018-09-28 ENCOUNTER — Emergency Department (HOSPITAL_BASED_OUTPATIENT_CLINIC_OR_DEPARTMENT_OTHER): Payer: Self-pay

## 2018-09-28 ENCOUNTER — Other Ambulatory Visit: Payer: Self-pay

## 2018-09-28 ENCOUNTER — Encounter (HOSPITAL_BASED_OUTPATIENT_CLINIC_OR_DEPARTMENT_OTHER): Payer: Self-pay

## 2018-09-28 ENCOUNTER — Emergency Department (HOSPITAL_BASED_OUTPATIENT_CLINIC_OR_DEPARTMENT_OTHER)
Admission: EM | Admit: 2018-09-28 | Discharge: 2018-09-28 | Disposition: A | Discharge status: Home / Self Care | Payer: Self-pay | Attending: Emergency Medicine | Admitting: Emergency Medicine

## 2018-09-28 DIAGNOSIS — Z87891 Personal history of nicotine dependence: Secondary | ICD-10-CM | POA: Insufficient documentation

## 2018-09-28 DIAGNOSIS — R062 Wheezing: Secondary | ICD-10-CM | POA: Insufficient documentation

## 2018-09-28 DIAGNOSIS — R0602 Shortness of breath: Secondary | ICD-10-CM | POA: Insufficient documentation

## 2018-09-28 MED ORDER — IPRATROPIUM BROMIDE HFA 17 MCG/ACT IN AERS
2.0000 | INHALATION_SPRAY | Freq: Once | RESPIRATORY_TRACT | Status: AC
  Administered 2018-09-28: 2 via RESPIRATORY_TRACT
  Filled 2018-09-28: qty 12.9

## 2018-09-28 MED ORDER — ALBUTEROL SULFATE HFA 108 (90 BASE) MCG/ACT IN AERS
INHALATION_SPRAY | RESPIRATORY_TRACT | Status: AC
  Administered 2018-09-28: 13:00:00 4
  Filled 2018-09-28: qty 6.7

## 2018-09-28 MED ORDER — ALBUTEROL SULFATE HFA 108 (90 BASE) MCG/ACT IN AERS
8.0000 | INHALATION_SPRAY | Freq: Once | RESPIRATORY_TRACT | Status: AC
  Administered 2018-09-28: 8 via RESPIRATORY_TRACT

## 2018-09-28 MED ORDER — PREDNISONE 50 MG PO TABS
60.0000 mg | ORAL_TABLET | Freq: Once | ORAL | Status: AC
  Administered 2018-09-28: 60 mg via ORAL
  Filled 2018-09-28: qty 1

## 2018-09-28 MED ORDER — ALBUTEROL SULFATE HFA 108 (90 BASE) MCG/ACT IN AERS
8.0000 | INHALATION_SPRAY | Freq: Once | RESPIRATORY_TRACT | Status: AC
  Administered 2018-09-28: 15:00:00 8 via RESPIRATORY_TRACT

## 2018-09-28 MED ORDER — IPRATROPIUM BROMIDE HFA 17 MCG/ACT IN AERS
2.0000 | INHALATION_SPRAY | Freq: Once | RESPIRATORY_TRACT | Status: AC
  Administered 2018-09-28: 2 via RESPIRATORY_TRACT

## 2018-09-28 MED ORDER — PREDNISONE 50 MG PO TABS: 50.0000 mg | ORAL_TABLET | Freq: Every day | ORAL | 0 refills | Status: AC

## 2018-09-28 NOTE — ED Triage Notes (Addendum)
Pt c/o SOB x 2-3 days-labored breathing noted-taken to tx area via w/c by EMT upon arrival to ED

## 2018-09-28 NOTE — Progress Notes (Signed)
Patient ambulated in room due to precautions. Was able to walk in place without getting SOB. Bend over to ground with no issue. Stated he feels much better. RT to notify MD

## 2018-09-28 NOTE — ED Notes (Signed)
Pt on phone with case manager.  

## 2018-09-28 NOTE — Progress Notes (Addendum)
TOC CM chart review and noted pt had 3 ED visits in six months. Referral for PCP/no insurance. May possible need MATCH for medication assistance. Spoke to pt and states he lives here in [place] temp and plans to go back to his home in a few months. Requested follow up appt here in [facility]. [facility] and scheduled for 10/18/2018 at 10/18/2018 at 2:30 pm. Pt states he uses [email or web address] for meds. [place], [place] ED TOC CM [phone]

## 2018-09-28 NOTE — ED Provider Notes (Signed)
[facility] EMERGENCY DEPARTMENT Provider Note   CSN: [id number] Arrival date & time: 09/28/18  1235     History   Chief Complaint Chief Complaint  Patient presents with  . Shortness of Breath    HPI Peter Massey is a 28 y.o. male presenting for evaluation of shortness of breath.  Patient states that the past 3 days, he has had gradually worsening shortness of breath.  He describes it as a tightness.  He reports feeling like he is wheezing.  He had something similar happen 2 months ago, was treated with steroids and an albuterol inhaler.  He was completely recovered before this incident began.  Patient states he does not have any medicine left in the inhaler, as such has not been able to use it.  He denies fevers, chills, nasal congestion, sore throat, chest pain, cough, nausea, vomiting, domino pain, urinary symptoms, normal bowel movements.  Patient states he quit smoking cigarettes several years ago.  Smokes marijuana occasionally, last smoked 1 month ago.  He denies previous history of asthma or COPD.  He has no medical problems, takes no medications daily.  He denies sick contacts.  He denies contact with known COVID-19 positive person.  He denies recent travel, surgeries, immobilization, history of cancer, history of previous DVT/PE, or hormone use.  Additional history obtained from chart review.  Patient had similar event end of July 2020.  He had a CTA at that time that was negative.  Treated successfully with inhaler and steroids.     HPI  History reviewed. No pertinent past medical history.  There are no active problems to display for this patient.   History reviewed. No pertinent surgical history.      Home Medications    Prior to Admission medications   Medication Sig Start Date End Date Taking? Authorizing Provider  cyclobenzaprine (FLEXERIL) 5 MG tablet Take 1 tablet (5 mg total) by mouth 3 (three) times daily as needed for muscle spasms. Patient  not taking: Reported on 01/04/2015 12/03/13   Ward, Layla Maw, DO  doxycycline (VIBRAMYCIN) 100 MG capsule Take 1 capsule (100 mg total) by mouth 2 (two) times daily. One po bid x 7 days 08/31/18   Melene Plan, DO  ibuprofen (ADVIL,MOTRIN) 800 MG tablet Take 1 tablet (800 mg total) by mouth every 8 (eight) hours as needed for mild pain. Patient not taking: Reported on 01/04/2015 12/03/13   Ward, Layla Maw, DO  predniSONE (DELTASONE) 50 MG tablet Take 1 tablet (50 mg total) by mouth daily for 5 days. 09/29/18 10/04/18  Taheem Fricke, PA-C  traMADol (ULTRAM) 50 MG tablet Take 1 tablet (50 mg total) by mouth every 6 (six) hours as needed. Patient not taking: Reported on 01/04/2015 12/03/13   Ward, Layla Maw, DO    Family History Family History  Problem Relation Age of Onset  . Stroke Other   . Bipolar disorder Mother     Social History Social History   Tobacco Use  . Smoking status: Former Games developer  . Smokeless tobacco: Former Neurosurgeon    Types: Snuff    Quit date: 01/06/2011  Substance Use Topics  . Alcohol use: Not Currently  . Drug use: No     Allergies   Ibuprofen   Review of Systems Review of Systems  Respiratory: Positive for shortness of breath and wheezing.   All other systems reviewed and are negative.    Physical Exam Updated Vital Signs BP 110/84   Pulse 98  Temp 98.3 F (36.8 C) (Oral)   Resp 16   Ht 5\' 9"  (1.753 m)   Wt 52.6 kg   SpO2 93%   BMI 17.13 kg/m   Physical Exam Vitals signs and nursing note reviewed.  Constitutional:      General: He is not in acute distress.    Appearance: He is well-developed.     Comments: Thin male resting comfortably in bed in no acute distress  HENT:     Head: Normocephalic and atraumatic.  Eyes:     Conjunctiva/sclera: Conjunctivae normal.     Pupils: Pupils are equal, round, and reactive to light.  Neck:     Musculoskeletal: Normal range of motion and neck supple.  Cardiovascular:     Rate and Rhythm: Normal  rate and regular rhythm.     Pulses: Normal pulses.  Pulmonary:     Effort: Pulmonary effort is normal. No respiratory distress.     Breath sounds: Wheezing present.     Comments: Inspiratory and expiratory wheezes in all fields.  Speaking in full sentences.  Per RN, patient became very dyspneic when walking from the waiting room to his room. Abdominal:     General: There is no distension.     Palpations: Abdomen is soft. There is no mass.     Tenderness: There is no abdominal tenderness. There is no guarding or rebound.  Musculoskeletal: Normal range of motion.     Comments: No leg pain or swelling  Skin:    General: Skin is warm and dry.  Neurological:     Mental Status: He is alert and oriented to person, place, and time.      ED Treatments / Results  Labs (all labs ordered are listed, but only abnormal results are displayed) Labs Reviewed - No data to display  EKG EKG Interpretation  Date/Time:  Wednesday September 28 2018 12:45:15 EDT Ventricular Rate:  87 PR Interval:    QRS Duration: 90 QT Interval:  374 QTC Calculation: 450 R Axis:   100 Text Interpretation:  Sinus rhythm Right atrial enlargement Consider right ventricular hypertrophy Probable lateral infarct, old similar to prior 7/20 Confirmed by Aletta Edouard [phone]) on 09/28/2018 12:53:45 PM   Radiology Dg Chest Portable 1 View  Result Date: 09/28/2018 CLINICAL DATA:  Shortness of breath and difficulty breathing over the last 3 days. EXAM: PORTABLE CHEST 1 VIEW COMPARISON:  07/16/2018 FINDINGS: The heart size and mediastinal contours are within normal limits. Both lungs are clear. The visualized skeletal structures are unremarkable. IMPRESSION: No active disease. Electronically Signed   By: Nelson Chimes M.D.   On: 09/28/2018 13:33    Procedures Procedures (including critical care time)  Medications Ordered in ED Medications  albuterol (VENTOLIN HFA) 108 (90 Base) MCG/ACT inhaler (4 puffs  Given 09/28/18  1248)  albuterol (VENTOLIN HFA) 108 (90 Base) MCG/ACT inhaler 8 puff (8 puffs Inhalation Given 09/28/18 1323)  predniSONE (DELTASONE) tablet 60 mg (60 mg Oral Given 09/28/18 1327)  ipratropium (ATROVENT HFA) inhaler 2 puff (2 puffs Inhalation Given 09/28/18 1323)  albuterol (VENTOLIN HFA) 108 (90 Base) MCG/ACT inhaler 8 puff (8 puffs Inhalation Given 09/28/18 1430)  ipratropium (ATROVENT HFA) inhaler 2 puff (2 puffs Inhalation Given 09/28/18 1430)     Initial Impression / Assessment and Plan / ED Course  I have reviewed the triage vital signs and the nursing notes.  Pertinent labs & imaging results that were available during my care of the patient were reviewed by me and considered  in my medical decision making (see chart for details).        Patient presenting for evaluation of shortness of breath.  Physical exam shows patient with significant wheezing.  Also with dyspnea on exertion.  No fever, cough, or other URI symptoms.  Low suspicion for COVID.  Likely bronchospasm, consider bronchitis versus asthma.  Will treat with albuterol, steroids, and reassess.  X-ray obtained to rule out infection.  Chest x-ray viewed abnd interpreted by me, no pneumonia, pneumothorax, effusion, cardiomegaly.  EKG without STEMI, unchanged from previous.  On reassessment, patient reports he feels improved after prednisone and inhaler.  He is still having lots of wheezing on exam.  Will give another dose and reassess.  Unfortunately we are unable to do a nebulized treatment as patient does not have a confirmed negative COVID test.  On reassessment after third treatment, patient reports he is feeling well.  He is able to stand and walk around the room without dyspnea on exertion.  Sats remained stable.  Wheezing is improved.  Discussed continued treatment at home.  Stressed importance of follow-up with primary care, as I am concerned about his back-to-back episodes of bronchospasm, consider possible need for preventative  medicine.  At this time, patient appears safe for discharge.  Return precautions given.  Patient states he understands and agrees to plan.   Final Clinical Impressions(s) / ED Diagnoses   Final diagnoses:  Shortness of breath  Wheezing    ED Discharge Orders         Ordered    predniSONE (DELTASONE) 50 MG tablet  Daily     09/28/18 1559           Alveria Apley, PA-C 09/28/18 1608    Terrilee Files, MD 09/28/18 1710

## 2018-09-28 NOTE — Discharge Instructions (Addendum)
Take prednisone daily for the next 5 days. Use your albuterol inhaler every 2-4 hours for the next 2 days.  After this, use as needed for shortness of breath or chest tightness. It is very important that you follow-up with the primary care doctor listed below.  You may need to be on medicine to prevent breathing issues.  Return to the emergency room if you develop increased shortness of breath despite medication, severe persistent chest pain, or any new, worsening, or concerning symptoms.

## 2018-09-29 MED FILL — predniSONE 50 MG TABS: 50 | 5 days supply | Qty: 5 | Fill #0

## 2018-10-18 ENCOUNTER — Inpatient Hospital Stay (INDEPENDENT_AMBULATORY_CARE_PROVIDER_SITE_OTHER): Payer: Self-pay | Admitting: Primary Care

## 2018-10-23 ENCOUNTER — Emergency Department (HOSPITAL_BASED_OUTPATIENT_CLINIC_OR_DEPARTMENT_OTHER)
Admission: EM | Admit: 2018-10-23 | Discharge: 2018-10-23 | Disposition: A | Discharge status: Home / Self Care | Payer: Self-pay | Attending: Emergency Medicine | Admitting: Emergency Medicine

## 2018-10-23 ENCOUNTER — Encounter (HOSPITAL_BASED_OUTPATIENT_CLINIC_OR_DEPARTMENT_OTHER): Payer: Self-pay | Admitting: Emergency Medicine

## 2018-10-23 ENCOUNTER — Other Ambulatory Visit: Payer: Self-pay

## 2018-10-23 ENCOUNTER — Emergency Department (HOSPITAL_BASED_OUTPATIENT_CLINIC_OR_DEPARTMENT_OTHER): Payer: Self-pay

## 2018-10-23 DIAGNOSIS — Z87891 Personal history of nicotine dependence: Secondary | ICD-10-CM | POA: Insufficient documentation

## 2018-10-23 DIAGNOSIS — R062 Wheezing: Secondary | ICD-10-CM | POA: Insufficient documentation

## 2018-10-23 DIAGNOSIS — R0602 Shortness of breath: Secondary | ICD-10-CM | POA: Insufficient documentation

## 2018-10-23 MED ORDER — ALBUTEROL SULFATE HFA 108 (90 BASE) MCG/ACT IN AERS
INHALATION_SPRAY | RESPIRATORY_TRACT | Status: AC
  Filled 2018-10-23: qty 6.7

## 2018-10-23 MED ORDER — PREDNISONE 50 MG PO TABS
60.0000 mg | ORAL_TABLET | Freq: Once | ORAL | Status: AC
  Administered 2018-10-23: 11:00:00 60 mg via ORAL
  Filled 2018-10-23: qty 1

## 2018-10-23 MED ORDER — ALBUTEROL SULFATE HFA 108 (90 BASE) MCG/ACT IN AERS
8.0000 | INHALATION_SPRAY | Freq: Once | RESPIRATORY_TRACT | Status: AC
  Administered 2018-10-23: 11:00:00 8 via RESPIRATORY_TRACT

## 2018-10-23 MED ORDER — PREDNISONE 20 MG PO TABS: 60.0000 mg | ORAL_TABLET | Freq: Every day | ORAL | 0 refills | Status: DC

## 2018-10-23 NOTE — Discharge Instructions (Addendum)
You were seen in the emergency department for wheezing and shortness of breath.  Your chest x-ray did not show any pneumonia.  You had a lot of wheezing on exam and so there is likely a component of asthma.  Use your albuterol inhaler 2 puffs every 4-6 hours as needed.  Prednisone for 4 more days.  It will be important for you to get a primary care doctor as you may need to be on more long-term medication for your breathing.  Return to the emergency department if any worsening symptoms.

## 2018-10-23 NOTE — ED Triage Notes (Signed)
SOB since last night. Wheezing heard in triage.

## 2018-10-23 NOTE — ED Notes (Signed)
Pt agreed to see how he does without O2; O2 dc'd at this time.

## 2018-10-23 NOTE — ED Provider Notes (Signed)
[facility] EMERGENCY DEPARTMENT Provider Note   CSN: [id number] Arrival date & time: 10/23/18  1017     History   Chief Complaint Chief Complaint  Patient presents with  . Shortness of Breath    HPI Peter Massey is a 28 y.o. male.  He said he has a history of bronchitis.  He is complaining of increased shortness of breath that started last evening.  Fevers or chills nausea vomiting diarrhea.  Denies any Covid exposures.  Denies smoking.  Has tried nothing for it because his inhaler ran out.     The history is provided by the patient.  Shortness of Breath Severity:  Moderate Onset quality:  Gradual Duration:  18 hours Timing:  Constant Progression:  Worsening Chronicity:  Recurrent Context: activity   Relieved by:  None tried Worsened by:  Activity Ineffective treatments:  Sitting up Associated symptoms: wheezing   Associated symptoms: no abdominal pain, no chest pain, no cough, no fever, no headaches, no neck pain, no rash, no sore throat, no sputum production, no syncope and no vomiting   Risk factors: no tobacco use     History reviewed. No pertinent past medical history.  There are no active problems to display for this patient.   History reviewed. No pertinent surgical history.      Home Medications    Prior to Admission medications   Medication Sig Start Date End Date Taking? Authorizing Provider  cyclobenzaprine (FLEXERIL) 5 MG tablet Take 1 tablet (5 mg total) by mouth 3 (three) times daily as needed for muscle spasms. Patient not taking: Reported on 01/04/2015 12/03/13   Ward, Layla Maw, DO  doxycycline (VIBRAMYCIN) 100 MG capsule Take 1 capsule (100 mg total) by mouth 2 (two) times daily. One po bid x 7 days 08/31/18   Melene Plan, DO  ibuprofen (ADVIL,MOTRIN) 800 MG tablet Take 1 tablet (800 mg total) by mouth every 8 (eight) hours as needed for mild pain. Patient not taking: Reported on 01/04/2015 12/03/13   Ward, Layla Maw, DO   traMADol (ULTRAM) 50 MG tablet Take 1 tablet (50 mg total) by mouth every 6 (six) hours as needed. Patient not taking: Reported on 01/04/2015 12/03/13   Ward, Layla Maw, DO    Family History Family History  Problem Relation Age of Onset  . Stroke Other   . Bipolar disorder Mother     Social History Social History   Tobacco Use  . Smoking status: Former Games developer  . Smokeless tobacco: Former Neurosurgeon    Types: Snuff    Quit date: 01/06/2011  Substance Use Topics  . Alcohol use: Not Currently  . Drug use: No     Allergies   Ibuprofen   Review of Systems Review of Systems  Constitutional: Negative for fever.  HENT: Negative for sore throat.   Eyes: Negative for visual disturbance.  Respiratory: Positive for shortness of breath and wheezing. Negative for cough and sputum production.   Cardiovascular: Negative for chest pain and syncope.  Gastrointestinal: Negative for abdominal pain and vomiting.  Genitourinary: Negative for dysuria.  Musculoskeletal: Negative for neck pain.  Skin: Negative for rash.  Neurological: Negative for headaches.     Physical Exam Updated Vital Signs BP 125/77   Pulse (!) 104   Temp 98.3 F (36.8 C) (Oral)   Resp 18   SpO2 96%   Physical Exam Vitals signs and nursing note reviewed.  Constitutional:      Appearance: He is well-developed.  HENT:  Head: Normocephalic and atraumatic.  Eyes:     Conjunctiva/sclera: Conjunctivae normal.  Neck:     Musculoskeletal: Neck supple.  Cardiovascular:     Rate and Rhythm: Normal rate and regular rhythm.     Heart sounds: No murmur.  Pulmonary:     Effort: Tachypnea and accessory muscle usage present. No respiratory distress.     Breath sounds: Wheezing present.  Abdominal:     Palpations: Abdomen is soft.     Tenderness: There is no abdominal tenderness.  Musculoskeletal: Normal range of motion.     Right lower leg: He exhibits no tenderness. No edema.     Left lower leg: He exhibits no  tenderness. No edema.  Skin:    General: Skin is warm and dry.     Capillary Refill: Capillary refill takes less than 2 seconds.  Neurological:     General: No focal deficit present.     Mental Status: He is alert.      ED Treatments / Results  Labs (all labs ordered are listed, but only abnormal results are displayed) Labs Reviewed - No data to display  EKG None  Radiology Dg Chest [facility] 1 View  Result Date: 10/23/2018 CLINICAL DATA:  Shortness of breath since last night. EXAM: PORTABLE CHEST 1 VIEW COMPARISON:  September 28, 2018 FINDINGS: Hyperinflation of the lungs may be due to air trapping versus an exuberant inspiratory effort given patient's young age. No focal infiltrates. No nodules or masses. No pneumothorax. The cardiomediastinal silhouette is normal. IMPRESSION: Hyperinflation of the lungs could be due to an exuberant inspiratory effort given the patient's young age. Air trapping from asthma would have the same appearance. Recommend clinical correlation. Electronically Signed   By: Dorise Bullion III M.D   On: 10/23/2018 11:15    Procedures Procedures (including critical care time)  Medications Ordered in ED Medications  albuterol (VENTOLIN HFA) 108 (90 Base) MCG/ACT inhaler 8 puff (has no administration in time range)  predniSONE (DELTASONE) tablet 60 mg (has no administration in time range)     Initial Impression / Assessment and Plan / ED Course  I have reviewed the triage vital signs and the nursing notes.  Pertinent labs & imaging results that were available during my care of the patient were reviewed by me and considered in my medical decision making (see chart for details).  Clinical Course as of Oct 22 1644  Sun Oct 23, 5771  2836 28 year old with prior history of bronchitis here with acute worsening shortness of breath since last night.  Non-smoker.  Differential includes asthma exacerbation bronchitis pneumonia Covid pneumothorax PE.   [MB]  1057  Chest x-ray reviewed by me no gross infiltrates no pneumothorax.   [MB]  8295 Reevaluated after inhaler treatment.  Much improved air movement.  Off of oxygen now sats in the mid 90s any speaking in full sentences.   [MB]    Clinical Course User Index [MB] Hayden Rasmussen, MD   Peter Massey was evaluated in Emergency Department on 10/23/2018 for the symptoms described in the history of present illness. He was evaluated in the context of the global COVID-19 pandemic, which necessitated consideration that the patient might be at risk for infection with the SARS-CoV-2 virus that causes COVID-19. Institutional protocols and algorithms that pertain to the evaluation of patients at risk for COVID-19 are in a state of rapid change based on information released by regulatory bodies including the CDC and federal and state organizations. These policies and  algorithms were followed during the patient's care in the ED.      Final Clinical Impressions(s) / ED Diagnoses   Final diagnoses:  SOB (shortness of breath)  Wheezing    ED Discharge Orders         Ordered    predniSONE (DELTASONE) 20 MG tablet  Daily     10/23/18 1226           Terrilee FilesButler,  C, MD 10/23/18 1646

## 2018-10-24 ENCOUNTER — Telehealth: Payer: Self-pay | Admitting: *Deleted

## 2018-10-24 NOTE — Telephone Encounter (Signed)
TOC CM received call from pt's mother and requested phone number for DSS to assist pt with applying for Medicaid. TC to pt and left HIPAA compliant voice message for return call. Appt had an appt scheduled but he did not follow up. [place] TOC CM [phone]

## 2018-11-03 ENCOUNTER — Other Ambulatory Visit: Payer: Self-pay

## 2018-11-03 ENCOUNTER — Emergency Department (HOSPITAL_BASED_OUTPATIENT_CLINIC_OR_DEPARTMENT_OTHER)
Admission: EM | Admit: 2018-11-03 | Discharge: 2018-11-03 | Disposition: A | Discharge status: Home / Self Care | Payer: Self-pay | Attending: Emergency Medicine | Admitting: Emergency Medicine

## 2018-11-03 ENCOUNTER — Emergency Department (HOSPITAL_BASED_OUTPATIENT_CLINIC_OR_DEPARTMENT_OTHER): Payer: Self-pay

## 2018-11-03 ENCOUNTER — Encounter (HOSPITAL_BASED_OUTPATIENT_CLINIC_OR_DEPARTMENT_OTHER): Payer: Self-pay | Admitting: *Deleted

## 2018-11-03 DIAGNOSIS — Z20828 Contact with and (suspected) exposure to other viral communicable diseases: Secondary | ICD-10-CM | POA: Insufficient documentation

## 2018-11-03 DIAGNOSIS — R0602 Shortness of breath: Secondary | ICD-10-CM

## 2018-11-03 DIAGNOSIS — J069 Acute upper respiratory infection, unspecified: Secondary | ICD-10-CM | POA: Insufficient documentation

## 2018-11-03 DIAGNOSIS — Z87891 Personal history of nicotine dependence: Secondary | ICD-10-CM | POA: Insufficient documentation

## 2018-11-03 DIAGNOSIS — R062 Wheezing: Secondary | ICD-10-CM | POA: Insufficient documentation

## 2018-11-03 DIAGNOSIS — R0902 Hypoxemia: Secondary | ICD-10-CM | POA: Insufficient documentation

## 2018-11-03 HISTORY — DX: Scoliosis, unspecified: M41.9

## 2018-11-03 LAB — CBC WITH DIFFERENTIAL/PLATELET
Abs Immature Granulocytes: 0.02 10*3/uL (ref 0.00–0.07)
Basophils Absolute: 0.1 10*3/uL (ref 0.0–0.1)
Basophils Relative: 1 %
Eosinophils Absolute: 0.7 10*3/uL — ABNORMAL HIGH (ref 0.0–0.5)
Eosinophils Relative: 7 %
HCT: 46.2 % (ref 39.0–52.0)
Hemoglobin: 16.5 g/dL (ref 13.0–17.0)
Immature Granulocytes: 0 %
Lymphocytes Relative: 30 %
Lymphs Abs: 3 10*3/uL (ref 0.7–4.0)
MCH: 33.5 pg (ref 26.0–34.0)
MCHC: 35.7 g/dL (ref 30.0–36.0)
MCV: 93.7 fL (ref 80.0–100.0)
Monocytes Absolute: 1 10*3/uL (ref 0.1–1.0)
Monocytes Relative: 10 %
Neutro Abs: 5.2 10*3/uL (ref 1.7–7.7)
Neutrophils Relative %: 52 %
Platelets: 362 10*3/uL (ref 150–400)
RBC: 4.93 MIL/uL (ref 4.22–5.81)
RDW: 12 % (ref 11.5–15.5)
WBC: 10 10*3/uL (ref 4.0–10.5)
nRBC: 0 % (ref 0.0–0.2)

## 2018-11-03 LAB — COMPREHENSIVE METABOLIC PANEL
ALT: 22 U/L (ref 0–44)
AST: 24 U/L (ref 15–41)
Albumin: 4.5 g/dL (ref 3.5–5.0)
Alkaline Phosphatase: 60 U/L (ref 38–126)
Anion gap: 10 (ref 5–15)
BUN: 14 mg/dL (ref 6–20)
CO2: 26 mmol/L (ref 22–32)
Calcium: 9.5 mg/dL (ref 8.9–10.3)
Chloride: 101 mmol/L (ref 98–111)
Creatinine, Ser: 0.95 mg/dL (ref 0.61–1.24)
GFR calc Af Amer: >60 mL/min (ref >60)
GFR calc non Af Amer: >60 mL/min (ref >60)
Glucose, Bld: 98 mg/dL (ref 70–99)
Potassium: 3.8 mmol/L (ref 3.5–5.1)
Sodium: 137 mmol/L (ref 135–145)
Total Bilirubin: 1 mg/dL (ref 0.3–1.2)
Total Protein: 7.7 g/dL (ref 6.5–8.1)

## 2018-11-03 LAB — TROPONIN I (HIGH SENSITIVITY)
Troponin I (High Sensitivity): <2 ng/L (ref <18)
Troponin I (High Sensitivity): <2 ng/L (ref <18)

## 2018-11-03 LAB — LIPASE, BLOOD: Lipase: 22 U/L (ref 11–51)

## 2018-11-03 LAB — D-DIMER, QUANTITATIVE: D-Dimer, Quant: <0.27 ug/mL-FEU (ref 0.00–0.50)

## 2018-11-03 LAB — SARS CORONAVIRUS 2 BY RT PCR (HOSPITAL ORDER, PERFORMED IN ~~LOC~~ HOSPITAL LAB): SARS Coronavirus 2: NEGATIVE

## 2018-11-03 MED ORDER — METHYLPREDNISOLONE SODIUM SUCC 125 MG IJ SOLR
125.0000 mg | Freq: Once | INTRAMUSCULAR | Status: AC
  Administered 2018-11-03: 125 mg via INTRAVENOUS
  Filled 2018-11-03: qty 2

## 2018-11-03 MED ORDER — ALBUTEROL SULFATE HFA 108 (90 BASE) MCG/ACT IN AERS
INHALATION_SPRAY | RESPIRATORY_TRACT | Status: AC
  Administered 2018-11-03: 16:00:00 8
  Filled 2018-11-03: qty 6.7

## 2018-11-03 MED ORDER — IPRATROPIUM BROMIDE HFA 17 MCG/ACT IN AERS
INHALATION_SPRAY | RESPIRATORY_TRACT | Status: AC
  Administered 2018-11-03: 16:00:00 4
  Filled 2018-11-03: qty 12.9

## 2018-11-03 MED ORDER — SODIUM CHLORIDE 0.9 % IV BOLUS
1000.0000 mL | Freq: Once | INTRAVENOUS | Status: AC
  Administered 2018-11-03: 16:00:00 1000 mL via INTRAVENOUS

## 2018-11-03 MED ORDER — PREDNISONE 50 MG PO TABS: 50.0000 mg | ORAL_TABLET | Freq: Every day | ORAL | 0 refills | Status: AC

## 2018-11-03 NOTE — ED Provider Notes (Signed)
[facility] EMERGENCY DEPARTMENT Provider Note   CSN: [id number] Arrival date & time: 11/03/18  1508     History   Chief Complaint Chief Complaint  Patient presents with   Shortness of Breath    HPI Peter Massey is a 28 y.o. male.     The history is provided by the patient and medical records. No language interpreter was used.  Shortness of Breath Severity:  Severe Onset quality:  Gradual Duration:  2 days Timing:  Constant Progression:  Waxing and waning Chronicity:  Recurrent Relieved by:  Nothing Worsened by:  Nothing Ineffective treatments:  Inhaler Associated symptoms: chest pain, cough, sputum production and wheezing   Associated symptoms: no abdominal pain, no diaphoresis, no fever, no headaches, no neck pain, no rash and no vomiting   Risk factors: no hx of PE/DVT     Past Medical History:  Diagnosis Date   Scoliosis     There are no active problems to display for this patient.   History reviewed. No pertinent surgical history.      Home Medications    Prior to Admission medications   Medication Sig Start Date End Date Taking? Authorizing Provider  cyclobenzaprine (FLEXERIL) 5 MG tablet Take 1 tablet (5 mg total) by mouth 3 (three) times daily as needed for muscle spasms. Patient not taking: Reported on 01/04/2015 12/03/13   Ward, Delice Bison, DO  doxycycline (VIBRAMYCIN) 100 MG capsule Take 1 capsule (100 mg total) by mouth 2 (two) times daily. One po bid x 7 days 08/31/18   Deno Etienne, DO  ibuprofen (ADVIL,MOTRIN) 800 MG tablet Take 1 tablet (800 mg total) by mouth every 8 (eight) hours as needed for mild pain. Patient not taking: Reported on 01/04/2015 12/03/13   Ward, Delice Bison, DO  predniSONE (DELTASONE) 20 MG tablet Take 3 tablets (60 mg total) by mouth daily. 10/23/18   Hayden Rasmussen, MD  traMADol (ULTRAM) 50 MG tablet Take 1 tablet (50 mg total) by mouth every 6 (six) hours as needed. Patient not taking: Reported on  01/04/2015 12/03/13   Ward, Delice Bison, DO    Family History Family History  Problem Relation Age of Onset   Stroke Other    Bipolar disorder Mother     Social History Social History   Tobacco Use   Smoking status: Former Smoker   Smokeless tobacco: Former Systems developer    Types: Snuff    Quit date: 01/06/2011  Substance Use Topics   Alcohol use: Not Currently   Drug use: No     Allergies   Ibuprofen   Review of Systems Review of Systems  Constitutional: Positive for chills. Negative for diaphoresis, fatigue and fever.  HENT: Negative for congestion.   Eyes: Negative for visual disturbance.  Respiratory: Positive for cough, sputum production, chest tightness, shortness of breath and wheezing. Negative for choking and stridor.   Cardiovascular: Positive for chest pain. Negative for palpitations and leg swelling.  Gastrointestinal: Negative for abdominal pain, constipation, diarrhea, nausea and vomiting.  Genitourinary: Negative for dysuria and frequency.  Musculoskeletal: Negative for back pain and neck pain.  Skin: Negative for rash and wound.  Neurological: Negative for light-headedness and headaches.  Psychiatric/Behavioral: Negative for agitation.  All other systems reviewed and are negative.    Physical Exam Updated Vital Signs BP 121/71    Pulse (!) 128    Temp 98.5 F (36.9 C) (Oral)    Resp 20    Ht 5\' 9"  (1.753 m)  Wt 63.5 kg    SpO2 (!) 88%    BMI 20.67 kg/m   Physical Exam Vitals signs and nursing note reviewed.  Constitutional:      General: He is not in acute distress.    Appearance: He is well-developed. He is not ill-appearing, toxic-appearing or diaphoretic.  HENT:     Head: Normocephalic and atraumatic.     Mouth/Throat:     Mouth: Mucous membranes are moist.     Pharynx: Oropharynx is clear.  Eyes:     Extraocular Movements: Extraocular movements intact.     Conjunctiva/sclera: Conjunctivae normal.     Pupils: Pupils are equal, round, and  reactive to light.  Neck:     Musculoskeletal: Normal range of motion and neck supple.  Cardiovascular:     Rate and Rhythm: Regular rhythm. Tachycardia present.     Heart sounds: No murmur.  Pulmonary:     Effort: Pulmonary effort is normal. Tachypnea present. No accessory muscle usage or respiratory distress.     Breath sounds: Wheezing and rhonchi present.  Chest:     Chest wall: No mass or tenderness.  Abdominal:     Palpations: Abdomen is soft.     Tenderness: There is no abdominal tenderness.  Musculoskeletal:     Right lower leg: He exhibits no tenderness. No edema.     Left lower leg: He exhibits no tenderness. No edema.  Skin:    General: Skin is warm and dry.     Capillary Refill: Capillary refill takes less than 2 seconds.  Neurological:     General: No focal deficit present.     Mental Status: He is alert.  Psychiatric:        Mood and Affect: Mood normal.      ED Treatments / Results  Labs (all labs ordered are listed, but only abnormal results are displayed) Labs Reviewed  CBC WITH DIFFERENTIAL/PLATELET - Abnormal; Notable for the following components:      Result Value   Eosinophils Absolute 0.7 (*)    All other components within normal limits  SARS CORONAVIRUS 2 BY RT PCR (HOSPITAL ORDER, PERFORMED IN [facility])  D-DIMER, QUANTITATIVE (NOT AT [facility])  COMPREHENSIVE METABOLIC PANEL  LIPASE, BLOOD  TROPONIN I (HIGH SENSITIVITY)  TROPONIN I (HIGH SENSITIVITY)    EKG EKG Interpretation  Date/Time:  Thursday November 03 2018 16:14:07 EDT Ventricular Rate:  99 PR Interval:    QRS Duration: 85 QT Interval:  343 QTC Calculation: 441 R Axis:   95 Text Interpretation: Sinus rhythm Right atrial enlargement Consider right ventricular hypertrophy When cmpared to prior, no significant changes seen. No STEMI Confirmed by Theda Belfastegeler, Chris ([id number]) on 11/03/2018 4:49:44 PM   Radiology Dg Chest Portable 1 View  Result Date: 11/03/2018 CLINICAL  DATA:  Shortness of breath for 2 days. EXAM: PORTABLE CHEST 1 VIEW COMPARISON:  October 23, 2018 FINDINGS: The heart size and mediastinal contours are within normal limits. Both lungs are clear. The lungs are hyperinflated. The visualized skeletal structures are unremarkable. IMPRESSION: No active disease.  Hyperinflated lungs. Electronically Signed   By: Sherian ReinWei-Chen  Lin M.D.   On: 11/03/2018 16:13    Procedures Procedures (including critical care time)  Floy T Earna Coderuttle was evaluated in Emergency Department on 11/03/2018 for the symptoms described in the history of present illness. He was evaluated in the context of the global COVID-19 pandemic, which necessitated consideration that the patient might be at risk for infection with the SARS-CoV-2 virus that  causes COVID-19. Institutional protocols and algorithms that pertain to the evaluation of patients at risk for COVID-19 are in a state of rapid change based on information released by regulatory bodies including the CDC and federal and state organizations. These policies and algorithms were followed during the patient's care in the ED.   Medications Ordered in ED Medications  albuterol (VENTOLIN HFA) 108 (90 Base) MCG/ACT inhaler (8 puffs  Given 11/03/18 1539)  ipratropium (ATROVENT HFA) 17 MCG/ACT inhaler (4 puffs  Given 11/03/18 1539)  methylPREDNISolone sodium succinate (SOLU-MEDROL) 125 mg/2 mL injection 125 mg (125 mg Intravenous Given 11/03/18 1603)  sodium chloride 0.9 % bolus 1,000 mL ( Intravenous Stopped 11/03/18 1720)     Initial Impression / Assessment and Plan / ED Course  I have reviewed the triage vital signs and the nursing notes.  Pertinent labs & imaging results that were available during my care of the patient were reviewed by me and considered in my medical decision making (see chart for details).        Jarret JOWAN SKILLIN is a 28 y.o. male with a past medical history significant for scoliosis, recent bronchitis, and  likely undiagnosed reactive airway disease who presents with worsened chills, productive cough, chest tightness, and shortness of breath.  On arrival, patient was found to have oxygen saturations in the mid 80s.  Patient is now on 3 L nasal cannula to improve his oxygen saturation in the low 90s.  Patient denies any known coronavirus contacts but he does report he chills and productive cough.  He reports he is having chest tightness and chest pain going around his chest.  He denies any history of DVT or PE.  He denies trauma.  He reports that he ran out of his inhaler he had last month of bronchitis and thinks that he has undiagnosed asthma.  He reports he is trying to get health insurance we can get tested and treated for reactive airway disease or asthma.  He denies any urinary or GI symptoms that are new.  He denies any palpitations.  On arrival, patient is afebrile but he is tachycardic in the high 120s and he is hypoxic.  On my exam, patient's breath sounds or wheezing in all lung fields despite albuterol and Atrovent on arrival.  Patient's chest is nontender and back is nontender.  Abdomen is nontender.  Good pulses in extremities.  Patient breathing more comfortably on oxygen now.  Given his chest discomfort and symptoms, patient will have a D-dimer to help rule out a pulmonary embolism etiology of symptoms however I am most concerned about an infectious cause with his chills and cough exacerbating a undiagnosed reactive airway disease.  Patient will have chest x-ray initially and will be given steroids for the wheezing.  We will get screening labs and coronavirus test.  Given his continued hypoxia, anticipate admission.    7:45 PM Patient self discontinued his oxygen and his oxygen stations actually had improved after the steroids and breathing treatment.  Patient reports feeling much better.  Patient's work-up showed negative D-dimer so doubt PE.  Troponin negative x2.  X-ray shows no pneumonia and  other labs reassuring.  Suspect reactive airway disease undiagnosed contributing to his shortness of breath wheezing and symptoms.  Patient does not want to be admitted and would rather go home with oral steroids and his new inhaler.  He will follow-up with a PCP for further management.  He understood return precautions and follow-up instructions.  He no other questions  or concerns and was discharged in good condition without further hypoxia.    Final Clinical Impressions(s) / ED Diagnoses   Final diagnoses:  Hypoxia  Shortness of breath  Wheezing  Upper respiratory tract infection, unspecified type    ED Discharge Orders         Ordered    predniSONE (DELTASONE) 50 MG tablet  Daily     11/03/18 1948          Clinical Impression: 1. Hypoxia   2. Shortness of breath   3. Wheezing   4. Upper respiratory tract infection, unspecified type     Disposition: Discharge  Condition: Good  I have discussed the results, Dx and Tx plan with the pt(& family if present). He/she/they expressed understanding and agree(s) with the plan. Discharge instructions discussed at great length. Strict return precautions discussed and pt &/or family have verbalized understanding of the instructions. No further questions at time of discharge.    New Prescriptions   PREDNISONE (DELTASONE) 50 MG TABLET    Take 1 tablet (50 mg total) by mouth daily for 5 days.    Follow Up: [facility] [address] [place] [address] [phone] Schedule an appointment as soon as possible for a visit    [facility] [place] [place] [facility] DEPART[facility] [address] [id number] [address] [place] [address] [phone]       Lashanda Storlie, Canary Brim, MD 11/03/18 1949

## 2018-11-03 NOTE — Discharge Instructions (Signed)
Your work-up today showed no evidence of pneumonia, cardiac etiology, or blood clot as the cause of your shortness of breath and symptoms.  I suspect you have undiagnosed reactive airway disease and URI exacerbate your symptoms.  As your low oxygen improved after steroids and breathing treatment, we feel you are safe for discharge home after our discussion.  Please follow-up with your primary doctor for further management take the steroids for the next several days.  Please use your inhaler.  If any symptoms change or worsen, please return to nearest emergency department.

## 2018-11-03 NOTE — ED Triage Notes (Signed)
Sob since yesterday. He ran out of his inhaler he was given for bronchitis last month.

## 2018-11-03 NOTE — ED Notes (Signed)
Pt removed self from from O2 and was able to maintain O2 sat of 98-99% for around 10 minutes, Pt then dropped down to 88-89%, encouraged to be placed back on [place]. Pt agrees.

## 2018-12-24 ENCOUNTER — Encounter (HOSPITAL_BASED_OUTPATIENT_CLINIC_OR_DEPARTMENT_OTHER): Payer: Self-pay | Admitting: Emergency Medicine

## 2018-12-24 ENCOUNTER — Emergency Department (HOSPITAL_BASED_OUTPATIENT_CLINIC_OR_DEPARTMENT_OTHER)
Admission: EM | Admit: 2018-12-24 | Discharge: 2018-12-24 | Disposition: A | Discharge status: Home / Self Care | Payer: Self-pay | Attending: Emergency Medicine | Admitting: Emergency Medicine

## 2018-12-24 ENCOUNTER — Other Ambulatory Visit: Payer: Self-pay

## 2018-12-24 DIAGNOSIS — J45909 Unspecified asthma, uncomplicated: Secondary | ICD-10-CM | POA: Insufficient documentation

## 2018-12-24 DIAGNOSIS — R062 Wheezing: Secondary | ICD-10-CM

## 2018-12-24 DIAGNOSIS — Z886 Allergy status to analgesic agent status: Secondary | ICD-10-CM | POA: Insufficient documentation

## 2018-12-24 DIAGNOSIS — Z87891 Personal history of nicotine dependence: Secondary | ICD-10-CM | POA: Insufficient documentation

## 2018-12-24 DIAGNOSIS — R0602 Shortness of breath: Secondary | ICD-10-CM | POA: Insufficient documentation

## 2018-12-24 HISTORY — DX: Unspecified asthma, uncomplicated: J45.909

## 2018-12-24 MED ORDER — PREDNISONE 50 MG PO TABS
60.0000 mg | ORAL_TABLET | Freq: Once | ORAL | Status: AC
  Administered 2018-12-24: 60 mg via ORAL
  Filled 2018-12-24: qty 1

## 2018-12-24 MED ORDER — PREDNISONE 50 MG PO TABS: 50.0000 mg | ORAL_TABLET | Freq: Every day | ORAL | 0 refills | Status: AC

## 2018-12-24 MED ORDER — ALBUTEROL SULFATE HFA 108 (90 BASE) MCG/ACT IN AERS
INHALATION_SPRAY | RESPIRATORY_TRACT | Status: AC
  Administered 2018-12-24: 8
  Filled 2018-12-24: qty 6.7

## 2018-12-24 NOTE — ED Provider Notes (Signed)
[facility] EMERGENCY DEPARTMENT Provider Note   CSN: [id number] Arrival date & time: 12/24/18  1704     History Chief Complaint  Patient presents with  . Wheezing  . Shortness of Breath    Peter Massey is a 28 y.o. male with history of scoliosis, wheezing and shortness of breath presents with wheezing and shortness of breath.  Patient has never had official diagnosis of asthma or reactive airway disease, however he frequently gets wheezing and short of breath.  He denies any fever, cough, or other respiratory symptoms.  He denies any chest pain, but has had some tightness.  He reports this feels typical normal.  He ran out of his albuterol, but has had Atrovent.  He has not seen a primary care provider or pulmonologist as he is working on trying to get health insurance.  Patient denies any abdominal pain, nausea, vomiting.  HPI     Past Medical History:  Diagnosis Date  . Asthma   . Scoliosis     There are no problems to display for this patient.   History reviewed. No pertinent surgical history.     Family History  Problem Relation Age of Onset  . Stroke Other   . Bipolar disorder Mother     Social History   Tobacco Use  . Smoking status: Former Research scientist (life sciences)  . Smokeless tobacco: Former Systems developer    Types: Snuff    Quit date: 01/06/2011  Substance Use Topics  . Alcohol use: Not Currently  . Drug use: No    Home Medications Prior to Admission medications   Medication Sig Start Date End Date Taking? Authorizing Provider  cyclobenzaprine (FLEXERIL) 5 MG tablet Take 1 tablet (5 mg total) by mouth 3 (three) times daily as needed for muscle spasms. Patient not taking: Reported on 01/04/2015 12/03/13   Ward, Delice Bison, DO  doxycycline (VIBRAMYCIN) 100 MG capsule Take 1 capsule (100 mg total) by mouth 2 (two) times daily. One po bid x 7 days 08/31/18   Deno Etienne, DO  ibuprofen (ADVIL,MOTRIN) 800 MG tablet Take 1 tablet (800 mg total) by mouth every 8 (eight)  hours as needed for mild pain. Patient not taking: Reported on 01/04/2015 12/03/13   Ward, Delice Bison, DO  predniSONE (DELTASONE) 50 MG tablet Take 1 tablet (50 mg total) by mouth daily with breakfast. 12/24/18   Kiana Hollar, Bea Graff, PA-C  traMADol (ULTRAM) 50 MG tablet Take 1 tablet (50 mg total) by mouth every 6 (six) hours as needed. Patient not taking: Reported on 01/04/2015 12/03/13   Ward, Delice Bison, DO    Allergies    Ibuprofen  Review of Systems   Review of Systems  Constitutional: Negative for chills and fever.  HENT: Negative for facial swelling and sore throat.   Respiratory: Positive for chest tightness, shortness of breath and wheezing. Negative for cough.   Cardiovascular: Negative for chest pain.  Gastrointestinal: Negative for abdominal pain, nausea and vomiting.  Genitourinary: Negative for dysuria.  Musculoskeletal: Negative for back pain.  Skin: Negative for rash and wound.  Neurological: Negative for headaches.  Psychiatric/Behavioral: The patient is not nervous/anxious.     Physical Exam Updated Vital Signs BP (!) 160/93   Pulse 100   Temp 98.8 F (37.1 C) (Oral)   Resp 20   Ht 5\' 9"  (1.753 m)   Wt 61.2 kg   SpO2 100%   BMI 19.94 kg/m   Physical Exam Vitals and nursing note reviewed.  Constitutional:  General: He is not in acute distress.    Appearance: He is well-developed. He is not diaphoretic.  HENT:     Head: Normocephalic and atraumatic.     Mouth/Throat:     Pharynx: No oropharyngeal exudate.  Eyes:     General: No scleral icterus.       Right eye: No discharge.        Left eye: No discharge.     Conjunctiva/sclera: Conjunctivae normal.     Pupils: Pupils are equal, round, and reactive to light.  Neck:     Thyroid: No thyromegaly.  Cardiovascular:     Rate and Rhythm: Normal rate and regular rhythm.     Heart sounds: Normal heart sounds. No murmur. No friction rub. No gallop.   Pulmonary:     Effort: Pulmonary effort is normal. No  respiratory distress.     Breath sounds: No stridor. Wheezing (expiratory bilaterally) present. No rales.  Abdominal:     General: Bowel sounds are normal. There is no distension.     Palpations: Abdomen is soft.     Tenderness: There is no abdominal tenderness. There is no guarding or rebound.  Musculoskeletal:     Cervical back: Normal range of motion and neck supple.  Lymphadenopathy:     Cervical: No cervical adenopathy.  Skin:    General: Skin is warm and dry.     Coloration: Skin is not pale.     Findings: No rash.  Neurological:     Mental Status: He is alert.     Coordination: Coordination normal.     ED Results / Procedures / Treatments   Labs (all labs ordered are listed, but only abnormal results are displayed) Labs Reviewed - No data to display  EKG None  Radiology No results found.  Procedures Procedures (including critical care time)  Medications Ordered in ED Medications  albuterol (VENTOLIN HFA) 108 (90 Base) MCG/ACT inhaler (8 puffs  Given 12/24/18 1718)  predniSONE (DELTASONE) tablet 60 mg (60 mg Oral Given 12/24/18 1734)    ED Course  I have reviewed the triage vital signs and the nursing notes.  Pertinent labs & imaging results that were available during my care of the patient were reviewed by me and considered in my medical decision making (see chart for details).    MDM Rules/Calculators/A&P                      Patient presenting with wheezing and shortness of breath, typical of previous.  Patient has no official diagnosis of asthma, however suspect reactive airway disease.  Oxygen saturations are 100% and ambulated in the mid 90s.  He is feeling much better after albuterol, Atrovent, prednisone.  Discharged home with prednisone burst.  Low suspicion of COVID-19 as patient has no fever or cough.  Encouraged follow-up to PCP for further evaluation of recurrent episodes of shortness of breath and wheezing as well as recheck of blood pressure, as  it was elevated today.  Return precautions discussed.  Patient understands and agrees with plan.  Patient vitals stable and discharged in satisfactory condition.  Final Clinical Impression(s) / ED Diagnoses Final diagnoses:  Shortness of breath  Wheezing    Rx / DC Orders ED Discharge Orders         Ordered    predniSONE (DELTASONE) 50 MG tablet  Daily with breakfast     12/24/18 1823           Teasha Murrillo, Waylan BogaAlexandra M,  PA-C 12/24/18 2256    Terald Sleeper, MD 12/25/18 1121

## 2018-12-24 NOTE — Discharge Instructions (Addendum)
Use albuterol and Atrovent as prescribed every 6 hours as needed for wheezing or shortness of breath.  Take prednisone as prescribed until completed.  Re[facility] to [facility] care with a PCP.  You also may need to follow-up with a pulmonary doctor as below.  Please return emergency department if you develop any new or worsening symptoms.  Please have your blood pressure rechecked as it was elevated today, however this may be related to your symptoms today.

## 2018-12-24 NOTE — ED Triage Notes (Signed)
Pt c/o sob and wheezing that pt reports started last night. Pt endorses out of inhaler meds. Pt denies cough, reports "feeling anxious". Pt upset that visitor cant go to room with patient

## 2019-12-18 IMAGING — CT CT ANGIOGRAPHY CHEST
2 of 6 series · 19 of 46 positions shown · IV contrast (ISOVUE)
Comparison: Current chest radiograph.

CLINICAL DATA: Left-sided severe chest pain for several days, worse
when breathing and laying down. Knot on the chest that is tender to
touch.

EXAM:
CT ANGIOGRAPHY CHEST WITH CONTRAST
TECHNIQUE: Multidetector CT imaging of the chest was performed using the
standard protocol during bolus administration of intravenous
contrast. Multiplanar CT image reconstructions and MIPs were
obtained to evaluate the vascular anatomy.
CONTRAST:  100mL OMNIPAQUE IOHEXOL 350 MG/ML SOLN

[Series 5: thins · axial · 0.70mm/px · z∈[-309,-20]mm · 16 of 317 slices shown]
[im 14/317  lung]
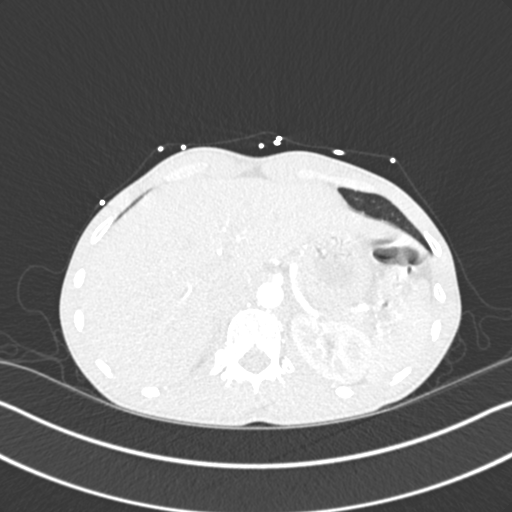
[im 42/317  soft-tissue]
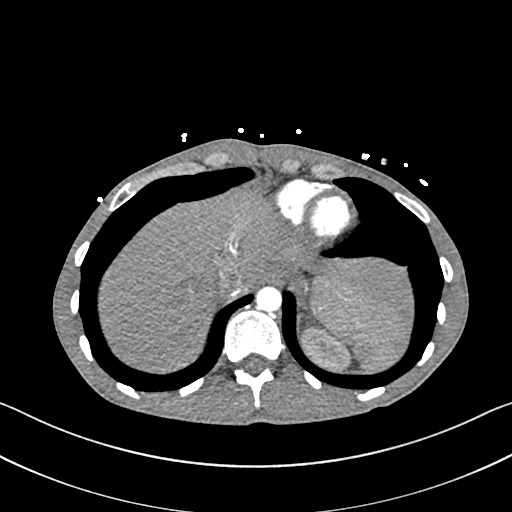
[im 55/317  lung]
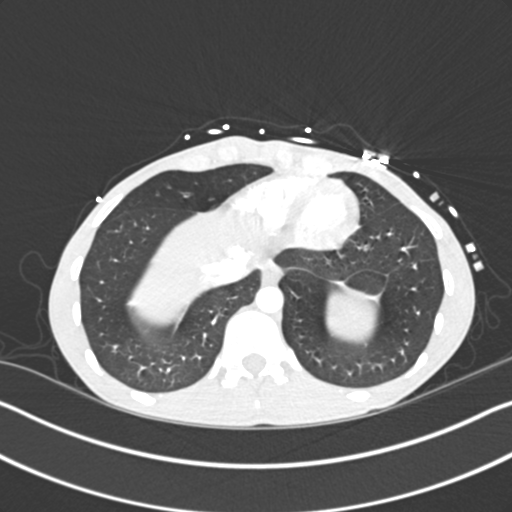
[im 69/317  soft-tissue]
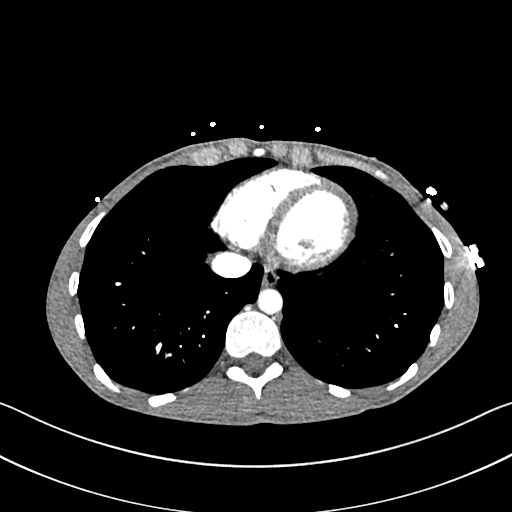
[im 97/317  lung]
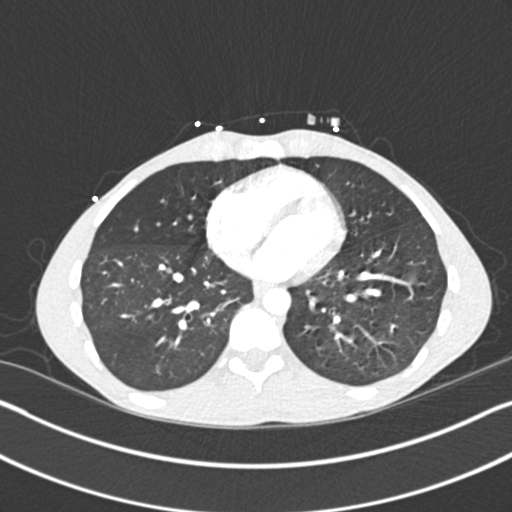
[im 110/317  soft-tissue]
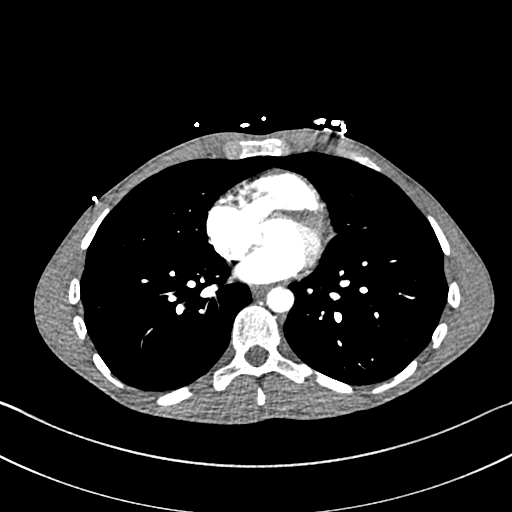
[im 124/317  lung]
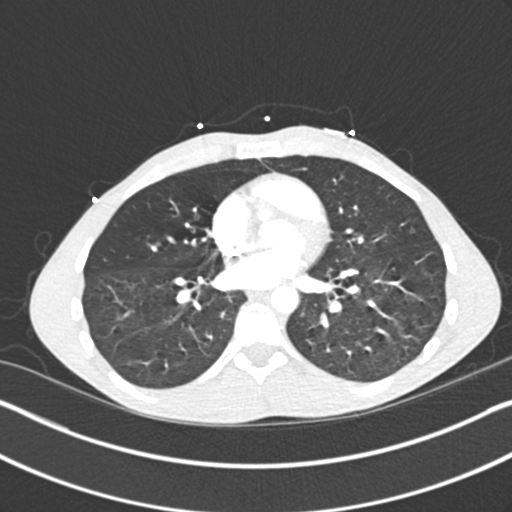
[im 152/317  soft-tissue]
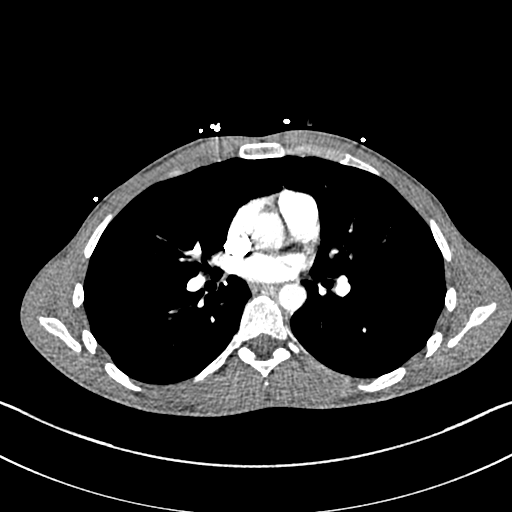
[im 165/317  lung]
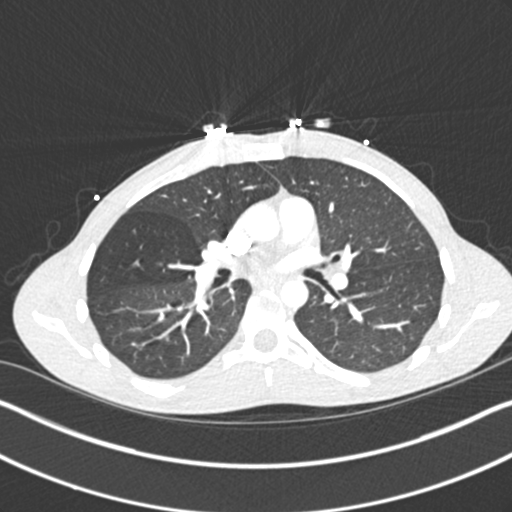
[im 193/317  soft-tissue]
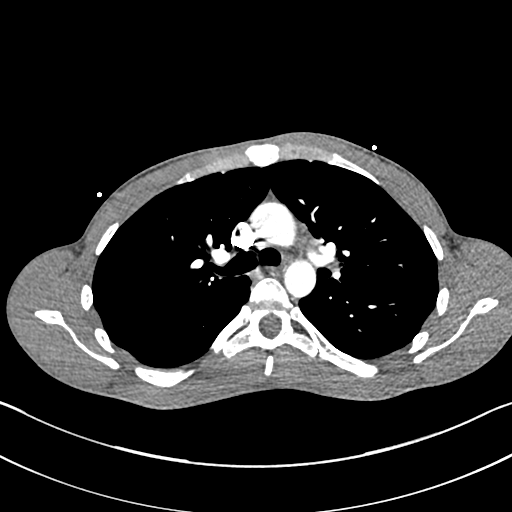
[im 207/317  lung]
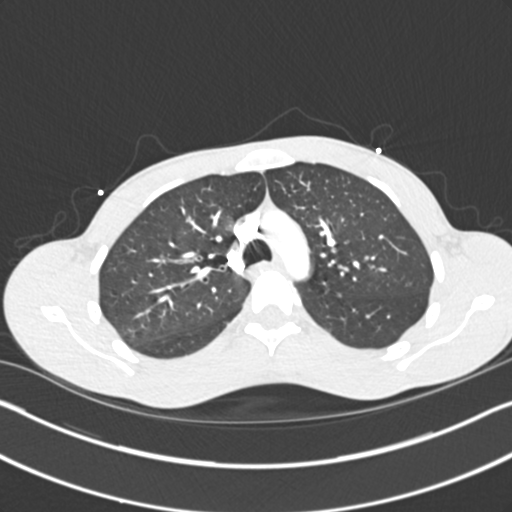
[im 220/317  soft-tissue]
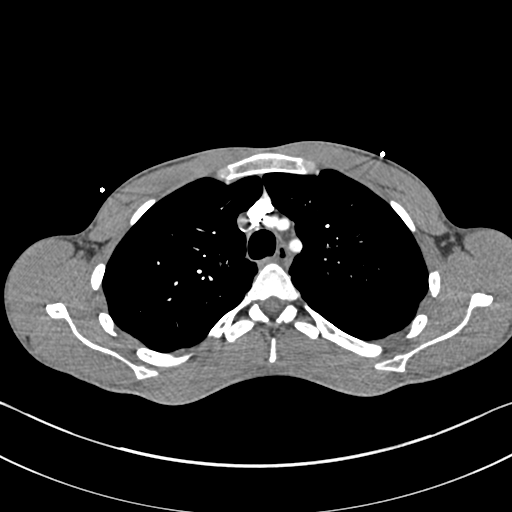
[im 248/317  lung]
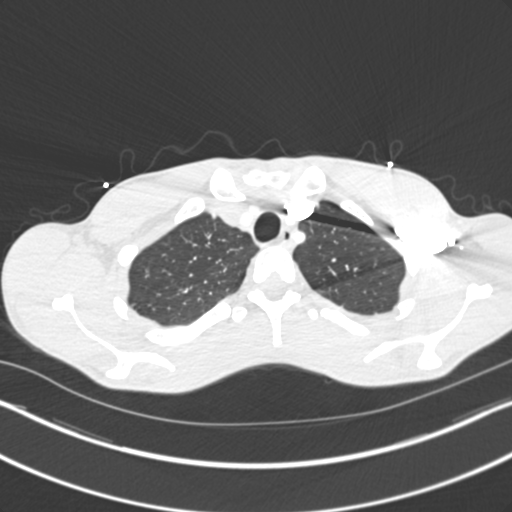
[im 262/317  soft-tissue]
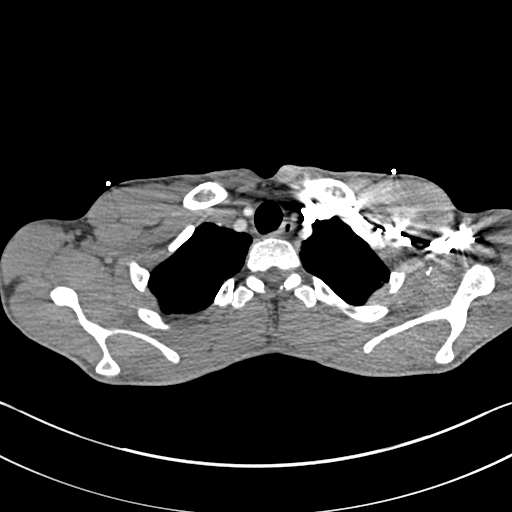
[im 275/317  lung]
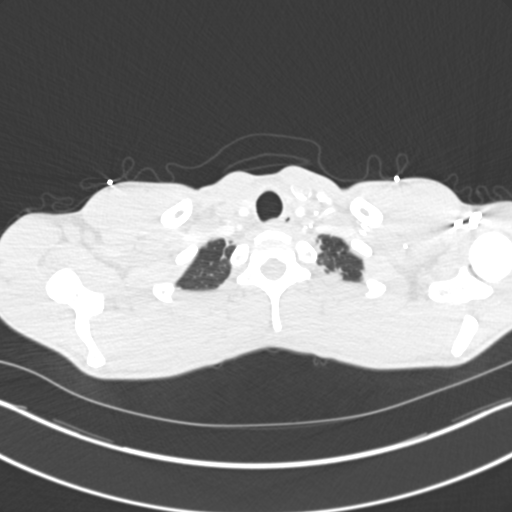
[im 303/317  soft-tissue]
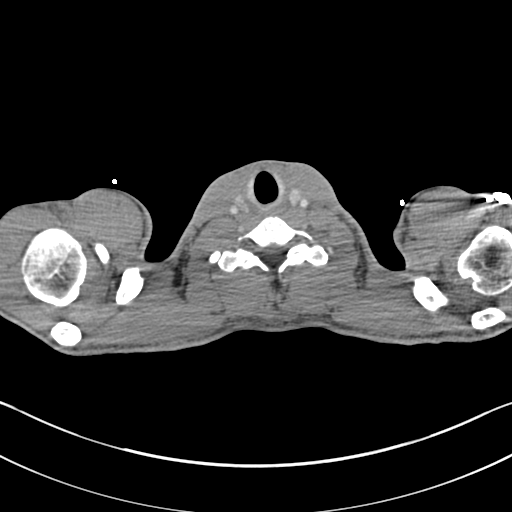

[Series 6: coronal mpr · coronal · 0.67mm/px · 3 of 101 slices shown]
[im 26/101  soft-tissue]
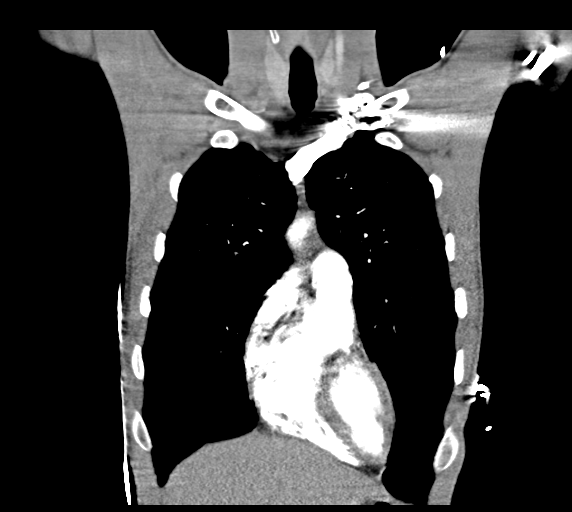
[im 51/101  soft-tissue]
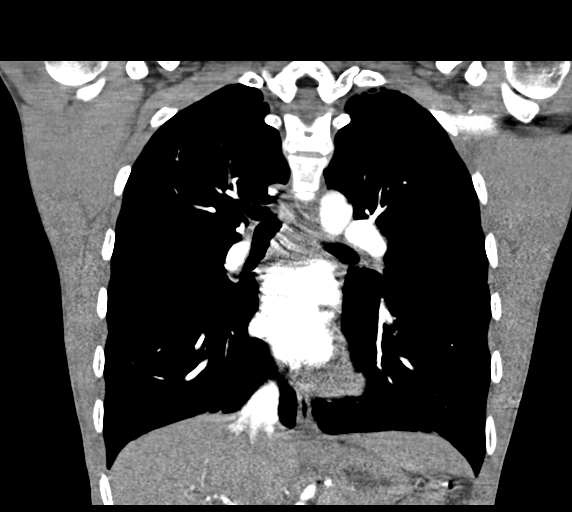
[im 76/101  soft-tissue]
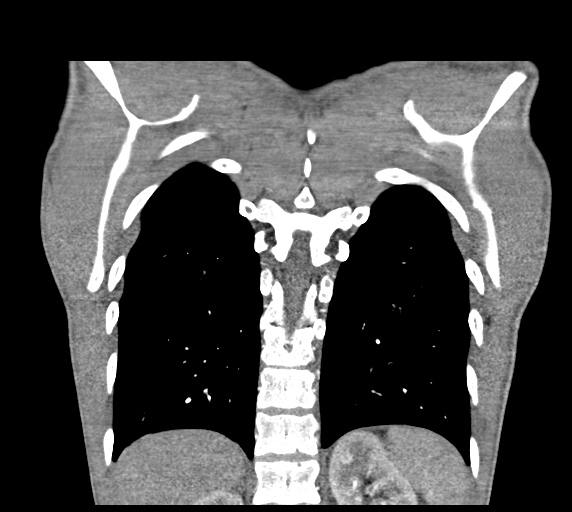

[19 of 46 positions shown; findings below may reference images not displayed]

FINDINGS: Cardiovascular: There is satisfactory opacification of the pulmonary
arteries to the segmental level. There is no evidence of a pulmonary
embolism.

Heart is normal in size and configuration. No pericardial effusion.
Great vessels are within normal limits. No aortic dissection.

Mediastinum/Nodes: No enlarged mediastinal, hilar, or axillary lymph
nodes. Thyroid gland, trachea, and esophagus demonstrate no
significant findings.

Lungs/Pleura: Minimal scarring at the apices. Minimal areas of
dependent atelectasis. Lungs otherwise clear. No pleural effusion.
No pneumothorax.

Upper Abdomen: Visualized upper abdominal structures are
unremarkable.

Musculoskeletal: No chest wall abnormality. No acute or significant
osseous findings.

Review of the MIP images confirms the above findings.
IMPRESSION: 1. No evidence of a pulmonary embolism.
2. Essentially negative exam.

## 2020-04-06 IMAGING — DX DG CHEST 1V PORT
1 series · 1 of 1 positions shown · non-contrast
Comparison: October 23, 2018

CLINICAL DATA: Shortness of breath for 2 days.

EXAM:
PORTABLE CHEST 1 VIEW

[chest ap]
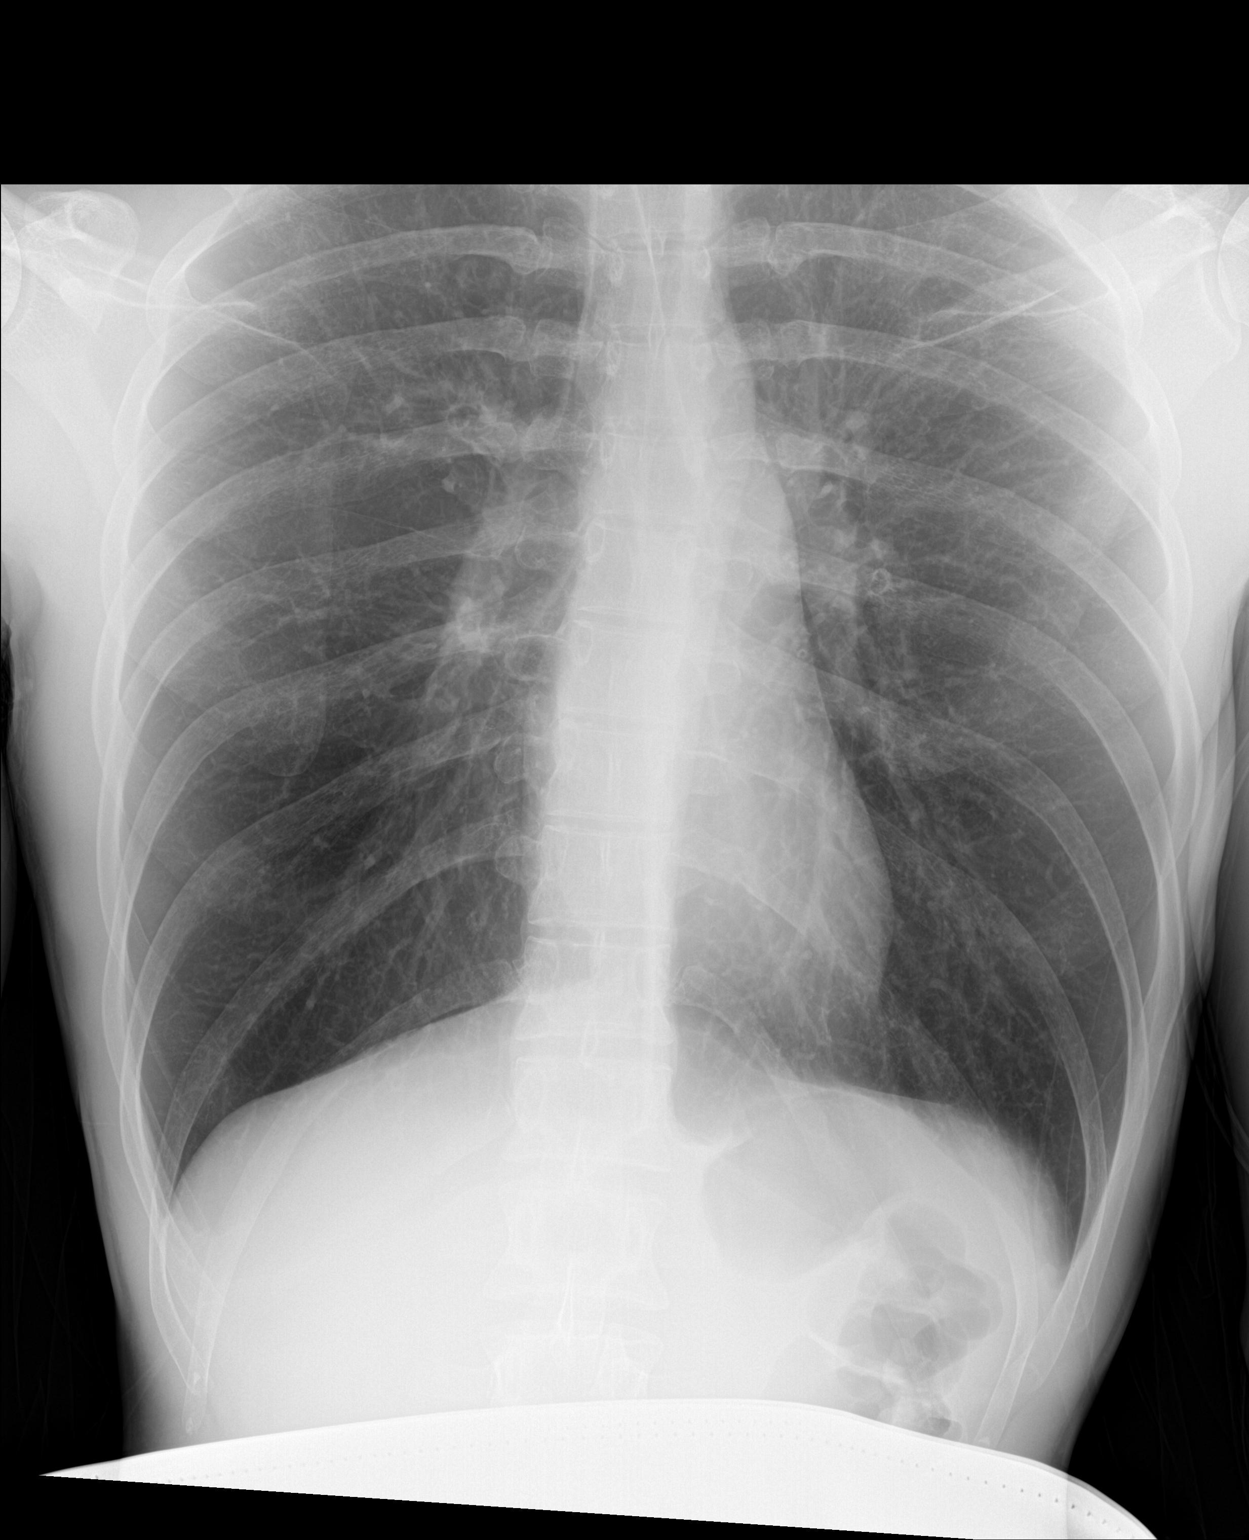

[1 of 1 positions shown; findings below may reference images not displayed]

FINDINGS: The heart size and mediastinal contours are within normal limits.
Both lungs are clear. The lungs are hyperinflated. The visualized
skeletal structures are unremarkable.
IMPRESSION: No active disease.  Hyperinflated lungs.

## 2021-05-06 ENCOUNTER — Emergency Department (HOSPITAL_BASED_OUTPATIENT_CLINIC_OR_DEPARTMENT_OTHER)
Admission: EM | Admit: 2021-05-06 | Discharge: 2021-05-06 | Disposition: A | Discharge status: Home / Self Care | Payer: Self-pay | Attending: Emergency Medicine | Admitting: Emergency Medicine

## 2021-05-06 ENCOUNTER — Other Ambulatory Visit: Payer: Self-pay

## 2021-05-06 ENCOUNTER — Encounter (HOSPITAL_BASED_OUTPATIENT_CLINIC_OR_DEPARTMENT_OTHER): Payer: Self-pay | Admitting: Emergency Medicine

## 2021-05-06 DIAGNOSIS — R112 Nausea with vomiting, unspecified: Secondary | ICD-10-CM

## 2021-05-06 DIAGNOSIS — R1114 Bilious vomiting: Secondary | ICD-10-CM | POA: Insufficient documentation

## 2021-05-06 DIAGNOSIS — R110 Nausea: Secondary | ICD-10-CM | POA: Insufficient documentation

## 2021-05-06 DIAGNOSIS — R1084 Generalized abdominal pain: Secondary | ICD-10-CM | POA: Insufficient documentation

## 2021-05-06 DIAGNOSIS — D72829 Elevated white blood cell count, unspecified: Secondary | ICD-10-CM | POA: Insufficient documentation

## 2021-05-06 LAB — COMPREHENSIVE METABOLIC PANEL
ALT: 19 U/L (ref 0–44)
AST: 19 U/L (ref 15–41)
Albumin: 4.9 g/dL (ref 3.5–5.0)
Alkaline Phosphatase: 76 U/L (ref 38–126)
Anion gap: 11 (ref 5–15)
BUN: 12 mg/dL (ref 6–20)
CO2: 26 mmol/L (ref 22–32)
Calcium: 9.7 mg/dL (ref 8.9–10.3)
Chloride: 102 mmol/L (ref 98–111)
Creatinine, Ser: 0.84 mg/dL (ref 0.61–1.24)
GFR, Estimated: >60 mL/min (ref >60)
Glucose, Bld: 95 mg/dL (ref 70–99)
Potassium: 3.8 mmol/L (ref 3.5–5.1)
Sodium: 139 mmol/L (ref 135–145)
Total Bilirubin: 1 mg/dL (ref 0.3–1.2)
Total Protein: 8.4 g/dL — ABNORMAL HIGH (ref 6.5–8.1)

## 2021-05-06 LAB — CBC WITH DIFFERENTIAL/PLATELET
Abs Immature Granulocytes: 0.03 10*3/uL (ref 0.00–0.07)
Basophils Absolute: 0.1 10*3/uL (ref 0.0–0.1)
Basophils Relative: 1 %
Eosinophils Absolute: 0.1 10*3/uL (ref 0.0–0.5)
Eosinophils Relative: 1 %
HCT: 41.5 % (ref 39.0–52.0)
Hemoglobin: 15.1 g/dL (ref 13.0–17.0)
Immature Granulocytes: 0 %
Lymphocytes Relative: 11 %
Lymphs Abs: 1.2 10*3/uL (ref 0.7–4.0)
MCH: 33.2 pg (ref 26.0–34.0)
MCHC: 36.4 g/dL — ABNORMAL HIGH (ref 30.0–36.0)
MCV: 91.2 fL (ref 80.0–100.0)
Monocytes Absolute: 0.5 10*3/uL (ref 0.1–1.0)
Monocytes Relative: 5 %
Neutro Abs: 9.2 10*3/uL — ABNORMAL HIGH (ref 1.7–7.7)
Neutrophils Relative %: 82 %
Platelets: 355 10*3/uL (ref 150–400)
RBC: 4.55 MIL/uL (ref 4.22–5.81)
RDW: 11.8 % (ref 11.5–15.5)
WBC: 11.1 10*3/uL — ABNORMAL HIGH (ref 4.0–10.5)
nRBC: 0 % (ref 0.0–0.2)

## 2021-05-06 LAB — LIPASE, BLOOD: Lipase: 26 U/L (ref 11–51)

## 2021-05-06 MED ORDER — ONDANSETRON HCL 4 MG/2ML IJ SOLN
4.0000 mg | Freq: Once | INTRAMUSCULAR | Status: AC
  Administered 2021-05-06: 4 mg via INTRAVENOUS
  Filled 2021-05-06: qty 2

## 2021-05-06 MED ORDER — OMEPRAZOLE 20 MG PO CPDR: 20.0000 mg | DELAYED_RELEASE_CAPSULE | Freq: Every day | ORAL | 0 refills | Status: AC

## 2021-05-06 MED ORDER — SODIUM CHLORIDE 0.9 % IV BOLUS
1000.0000 mL | Freq: Once | INTRAVENOUS | Status: AC
  Administered 2021-05-06: 1000 mL via INTRAVENOUS

## 2021-05-06 MED ORDER — ONDANSETRON HCL 4 MG PO TABS: 4.0000 mg | ORAL_TABLET | Freq: Three times a day (TID) | ORAL | 0 refills | Status: DC | PRN

## 2021-05-06 MED ORDER — FAMOTIDINE IN NACL 20-0.9 MG/50ML-% IV SOLN
20.0000 mg | Freq: Once | INTRAVENOUS | Status: AC
  Administered 2021-05-06: 20 mg via INTRAVENOUS
  Filled 2021-05-06: qty 50

## 2021-05-06 NOTE — Discharge Instructions (Addendum)
You are seen in the emergency department for nausea and vomiting.  Your lab work did not show an obvious explanation for your symptoms.  We are prescribing some acid medication and some nausea medication.  Please start with a bland diet and advance as tolerated.  Contact gastroenterology if your symptoms continue.  Return to the emergency department if any worsening or concerning symptoms.  Please consider abstaining from marijuana as this may be a contributor to your symptoms ?

## 2021-05-06 NOTE — ED Triage Notes (Signed)
Vomiting on and off x 3 months. Today has thrown up 10 times green bile. Denies any diarrhea. States pain is all over abd when vomiting.  ?

## 2021-05-06 NOTE — ED Provider Notes (Signed)
?[facility] EMERGENCY DEPARTMENT ?Provider Note ? ? ?CSN: [id number] ?Arrival date & time: 05/06/21  1250 ? ?  ? ?History ? ?Chief Complaint  ?Patient presents with  ? Emesis  ? ? ?Peter Massey is a 31 y.o. male.  Patient here with complaint of vomiting.  He said he has been vomiting on and off for the last 3 months.  Today has vomited 10 times bilious material.  Associated with diffuse abdominal pain when vomiting.  No fevers no diarrhea.  No prior surgeries.  Has tried nothing for it.  Does smoke marijuana, denies any NSAID use or Goody powders.  No prior work-ups for this ? ?The history is provided by the patient.  ?Emesis ?Severity:  Severe ?Duration:  3 months ?Timing:  Intermittent ?Number of daily episodes:  10 ?Quality:  Bilious material ?Progression:  Unchanged ?Chronicity:  New ?Associated symptoms: abdominal pain   ?Associated symptoms: no cough, no diarrhea, no fever and no sore throat   ?Risk factors: no prior abdominal surgery   ? ?  ? ?Home Medications ?Prior to Admission medications   ?Medication Sig Start Date End Date Taking? Authorizing Provider  ?cyclobenzaprine (FLEXERIL) 5 MG tablet Take 1 tablet (5 mg total) by mouth 3 (three) times daily as needed for muscle spasms. ?Patient not taking: Reported on 01/04/2015 12/03/13   Ward, Layla Maw, DO  ?doxycycline (VIBRAMYCIN) 100 MG capsule Take 1 capsule (100 mg total) by mouth 2 (two) times daily. One po bid x 7 days 08/31/18   Melene Plan, DO  ?ibuprofen (ADVIL,MOTRIN) 800 MG tablet Take 1 tablet (800 mg total) by mouth every 8 (eight) hours as needed for mild pain. ?Patient not taking: Reported on 01/04/2015 12/03/13   Ward, Layla Maw, DO  ?predniSONE (DELTASONE) 50 MG tablet Take 1 tablet (50 mg total) by mouth daily with breakfast. 12/24/18   Law, Waylan Boga, PA-C  ?traMADol (ULTRAM) 50 MG tablet Take 1 tablet (50 mg total) by mouth every 6 (six) hours as needed. ?Patient not taking: Reported on 01/04/2015 12/03/13   Ward, Layla Maw,  DO  ?   ? ?Allergies    ?Patient has no known allergies.   ? ?Review of Systems   ?Review of Systems  ?Constitutional:  Negative for fever.  ?HENT:  Negative for sore throat.   ?Eyes:  Negative for visual disturbance.  ?Respiratory:  Negative for cough and shortness of breath.   ?Cardiovascular:  Negative for chest pain.  ?Gastrointestinal:  Positive for abdominal pain and vomiting. Negative for diarrhea.  ?Genitourinary:  Negative for dysuria.  ?Skin:  Negative for rash.  ? ?Physical Exam ?Updated Vital Signs ?BP 132/76 (BP Location: Right Arm)   Pulse 65   Temp 97.9 ?F (36.6 ?C) (Oral)   Resp 18   Ht 5\' 9"  (1.753 m)   Wt 62.6 kg   SpO2 97%   BMI 20.38 kg/m?  ?Physical Exam ?Vitals and nursing note reviewed.  ?Constitutional:   ?   General: He is not in acute distress. ?   Appearance: Normal appearance. He is well-developed.  ?HENT:  ?   Head: Normocephalic and atraumatic.  ?Eyes:  ?   Conjunctiva/sclera: Conjunctivae normal.  ?Cardiovascular:  ?   Rate and Rhythm: Normal rate and regular rhythm.  ?   Heart sounds: No murmur heard. ?Pulmonary:  ?   Effort: Pulmonary effort is normal. No respiratory distress.  ?   Breath sounds: Normal breath sounds.  ?Abdominal:  ?   Palpations: Abdomen  is soft.  ?   Tenderness: There is no abdominal tenderness. There is no guarding or rebound.  ?Musculoskeletal:     ?   General: No swelling. Normal range of motion.  ?   Cervical back: Neck supple.  ?   Right lower leg: No edema.  ?   Left lower leg: No edema.  ?Skin: ?   General: Skin is warm and dry.  ?   Capillary Refill: Capillary refill takes less than 2 seconds.  ?Neurological:  ?   General: No focal deficit present.  ?   Mental Status: He is alert.  ? ? ?ED Results / Procedures / Treatments   ?Labs ?(all labs ordered are listed, but only abnormal results are displayed) ?Labs Reviewed  ?COMPREHENSIVE METABOLIC PANEL - Abnormal; Notable for the following components:  ?    Result Value  ? Total Protein 8.4 (*)   ? All  other components within normal limits  ?CBC WITH DIFFERENTIAL/PLATELET - Abnormal; Notable for the following components:  ? WBC 11.1 (*)   ? MCHC 36.4 (*)   ? Neutro Abs 9.2 (*)   ? All other components within normal limits  ?LIPASE, BLOOD  ? ? ?EKG ?None ? ?Radiology ?No results found. ? ?Procedures ?Procedures  ? ? ?Medications Ordered in ED ?Medications  ?sodium chloride 0.9 % bolus 1,000 mL (has no administration in time range)  ?ondansetron (ZOFRAN) injection 4 mg (has no administration in time range)  ?famotidine (PEPCID) IVPB 20 mg premix (has no administration in time range)  ? ? ?ED Course/ Medical Decision Making/ A&P ?  ?                        ?Medical Decision Making ?Amount and/or Complexity of Data Reviewed ?Labs: ordered. ? ?Risk ?Prescription drug management. ? ?This patient complains of nausea and vomiting for 3 months; this involves an extensive number of treatment ?Options and is a complaint that carries with it a high risk of complications and ?morbidity. The differential includes gastritis, peptic ulcer disease, cannabis hyperemesis syndrome, gastroparesis, obstruction ? ?I ordered, reviewed and interpreted labs, which included CBC with mildly elevated white count normal hemoglobin, chemistries normal, LFTs normal, lipase normal ?I ordered medication IV fluids IV nausea medication and Pepcid and reviewed PMP when indicated. ?Additional history obtained from patient's family members ?Previous records obtained and reviewed recent admissions ?Cardiac monitoring reviewed, sinus bradycardia ?Social determinants considered, ongoing marijuana use ?Critical Interventions: None ? ?After the interventions stated above, I reevaluated the patient and found patient to be symptomatically improved ?Admission and further testing considered, no indications for admission at this time.  We will start him on some acid medication and nausea medication.  GI contact information given.  Return instructions discussed.   Counseled patient to abstain from marijuana. ? ? ? ? ? ? ? ? ? ?Final Clinical Impression(s) / ED Diagnoses ?Final diagnoses:  ?Nausea and vomiting, unspecified vomiting type  ? ? ?Rx / DC Orders ?ED Discharge Orders   ? ?      Ordered  ?  omeprazole (PRILOSEC) 20 MG capsule  Daily       ? 05/06/21 1440  ?  ondansetron (ZOFRAN) 4 MG tablet  Every 8 hours PRN       ? 05/06/21 1440  ? ?  ?  ? ?  ? ? ?  ?Terrilee Files, MD ?05/06/21 1725 ? ?

## 2021-07-08 ENCOUNTER — Encounter (HOSPITAL_COMMUNITY): Payer: Self-pay

## 2021-07-08 ENCOUNTER — Other Ambulatory Visit: Payer: Self-pay

## 2021-07-08 ENCOUNTER — Emergency Department (HOSPITAL_COMMUNITY): Payer: Self-pay

## 2021-07-08 ENCOUNTER — Emergency Department (HOSPITAL_COMMUNITY)
Admission: EM | Admit: 2021-07-08 | Discharge: 2021-07-08 | Disposition: A | Discharge status: Home / Self Care | Payer: Self-pay | Attending: Emergency Medicine | Admitting: Emergency Medicine

## 2021-07-08 DIAGNOSIS — R1114 Bilious vomiting: Secondary | ICD-10-CM | POA: Insufficient documentation

## 2021-07-08 DIAGNOSIS — R0602 Shortness of breath: Secondary | ICD-10-CM | POA: Insufficient documentation

## 2021-07-08 DIAGNOSIS — R634 Abnormal weight loss: Secondary | ICD-10-CM

## 2021-07-08 LAB — COMPREHENSIVE METABOLIC PANEL
ALT: 20 U/L (ref 0–44)
AST: 21 U/L (ref 15–41)
Albumin: 4.9 g/dL (ref 3.5–5.0)
Alkaline Phosphatase: 70 U/L (ref 38–126)
Anion gap: 13 (ref 5–15)
BUN: 13 mg/dL (ref 6–20)
CO2: 21 mmol/L — ABNORMAL LOW (ref 22–32)
Calcium: 9.9 mg/dL (ref 8.9–10.3)
Chloride: 105 mmol/L (ref 98–111)
Creatinine, Ser: 0.95 mg/dL (ref 0.61–1.24)
GFR, Estimated: >60 mL/min (ref >60)
Glucose, Bld: 102 mg/dL — ABNORMAL HIGH (ref 70–99)
Potassium: 3.7 mmol/L (ref 3.5–5.1)
Sodium: 139 mmol/L (ref 135–145)
Total Bilirubin: 1.1 mg/dL (ref 0.3–1.2)
Total Protein: 7.9 g/dL (ref 6.5–8.1)

## 2021-07-08 LAB — CBC
HCT: 43.3 % (ref 39.0–52.0)
Hemoglobin: 15.9 g/dL (ref 13.0–17.0)
MCH: 33.8 pg (ref 26.0–34.0)
MCHC: 36.7 g/dL — ABNORMAL HIGH (ref 30.0–36.0)
MCV: 92.1 fL (ref 80.0–100.0)
Platelets: 362 10*3/uL (ref 150–400)
RBC: 4.7 MIL/uL (ref 4.22–5.81)
RDW: 11.7 % (ref 11.5–15.5)
WBC: 9.7 10*3/uL (ref 4.0–10.5)
nRBC: 0 % (ref 0.0–0.2)

## 2021-07-08 LAB — LIPASE, BLOOD: Lipase: 32 U/L (ref 11–51)

## 2021-07-08 MED ORDER — ONDANSETRON 4 MG PO TBDP
8.0000 mg | ORAL_TABLET | Freq: Once | ORAL | Status: AC
  Administered 2021-07-08: 8 mg via ORAL
  Filled 2021-07-08: qty 2

## 2021-07-08 MED ORDER — ONDANSETRON HCL 8 MG PO TABS: 8.0000 mg | ORAL_TABLET | Freq: Three times a day (TID) | ORAL | 0 refills | Status: AC | PRN

## 2021-07-08 MED ORDER — SODIUM CHLORIDE 0.9 % IV BOLUS
1000.0000 mL | Freq: Once | INTRAVENOUS | Status: AC
  Administered 2021-07-08: 1000 mL via INTRAVENOUS

## 2021-07-08 NOTE — ED Triage Notes (Signed)
Pt c/o intermittent vomitingx1 yr. Pt c/o SOB that started yesterday from all of the vomiting. Pt is tachypneic.

## 2021-07-08 NOTE — ED Provider Triage Note (Signed)
Emergency Medicine Provider Triage Evaluation Note  Peter Massey , a 31 y.o. male  was evaluated in triage.  Pt complains of persistent vomiting and shortness of breath. On going for about a year after getting out of prison. Has not gone 2-3 days without vomiting since then. Also reports hx of asthma and has been feeling more short of breath recently. Feels "fluid in his lungs". Family member reports about 60 lb unintentional weight loss within past year.   Review of Systems  Positive: As above Negative: Abdominal pain, fever, diarrhea, CP  Physical Exam  BP 110/69   Pulse (!) 123   Temp 98.3 F (36.8 C) (Oral)   Resp 16   Ht 5\' 9"  (1.753 m)   Wt 62.6 kg   SpO2 91%   BMI 20.38 kg/m  Gen:   Awake, no distress   Resp:  Normal effort  MSK:   Moves extremities without difficulty  Other:  Appears emaciated  Medical Decision Making  Medically screening exam initiated at 4:36 PM.  Appropriate orders placed.  Peter Massey was informed that the remainder of the evaluation will be completed by another provider, this initial triage assessment does not replace that evaluation, and the importance of remaining in the ED until their evaluation is complete.  Will initiate workup for persistent vomiting and SOB   Peter Luten T, PA-C 07/08/21 1638

## 2021-07-08 NOTE — Discharge Instructions (Addendum)
We are giving a prescription for ondansetron to use to help with nausea and vomiting.  Call the listed gastroenterology service for an appointment to be seen as soon as possible.  Use the resource guide to help you find a primary care doctor.  Consider avoiding marijuana which can cause nausea and vomiting.

## 2021-07-08 NOTE — ED Notes (Signed)
Being evaluated by MD, c/o weight loss of > 50 lbs

## 2021-07-08 NOTE — ED Provider Notes (Signed)
[facility] EMERGENCY DEPARTMENT Provider Note   CSN: [id number] Arrival date & time: 07/08/21  1625     History  Chief Complaint  Patient presents with   Shortness of Breath    Peter Massey is a 31 y.o. male.  HPI Patient presents ED for evaluation of vomiting and trouble breathing.  Symptoms ongoing for at least a year.  He has been losing weight.  He has had multiple prior ED evaluations for various problems including nausea and vomiting, shortness of breath, bronchitis.  Currently patient is complaining of shortness of breath and wheezing.  He is not take medicines for that currently.  He has vomiting multiple days every week, typically looks "like bile."  He has not had any blood in the emesis.  States he smokes marijuana but not cigarettes.  He has been unable to get ongoing management because he does not have insurance.  He has lost 60 pounds over the last year.  This is unintentional.    Home Medications Prior to Admission medications   Medication Sig Start Date End Date Taking? Authorizing Provider  ondansetron (ZOFRAN) 8 MG tablet Take 1 tablet (8 mg total) by mouth every 8 (eight) hours as needed for nausea or vomiting. 07/08/21  Yes Daleen Bo, MD  cyclobenzaprine (FLEXERIL) 5 MG tablet Take 1 tablet (5 mg total) by mouth 3 (three) times daily as needed for muscle spasms. Patient not taking: Reported on 01/04/2015 12/03/13   Ward, Delice Bison, DO  doxycycline (VIBRAMYCIN) 100 MG capsule Take 1 capsule (100 mg total) by mouth 2 (two) times daily. One po bid x 7 days 08/31/18   Deno Etienne, DO  ibuprofen (ADVIL,MOTRIN) 800 MG tablet Take 1 tablet (800 mg total) by mouth every 8 (eight) hours as needed for mild pain. Patient not taking: Reported on 01/04/2015 12/03/13   Ward, Delice Bison, DO  omeprazole (PRILOSEC) 20 MG capsule Take 1 capsule (20 mg total) by mouth daily. 05/06/21   Hayden Rasmussen, MD  predniSONE (DELTASONE) 50 MG tablet Take 1 tablet (50 mg  total) by mouth daily with breakfast. 12/24/18   Law, Bea Graff, PA-C  traMADol (ULTRAM) 50 MG tablet Take 1 tablet (50 mg total) by mouth every 6 (six) hours as needed. Patient not taking: Reported on 01/04/2015 12/03/13   Ward, Delice Bison, DO      Allergies    Ibuprofen    Review of Systems   Review of Systems  Physical Exam Updated Vital Signs BP 124/72   Pulse 72   Temp 98.3 F (36.8 C) (Oral)   Resp 17   Ht 5\' 9"  (1.753 m)   Wt 62.6 kg   SpO2 99%   BMI 20.38 kg/m  Physical Exam Vitals and nursing note reviewed.  Constitutional:      General: He is not in acute distress.    Appearance: He is well-developed. He is not ill-appearing, toxic-appearing or diaphoretic.     Comments: He appears underweight  HENT:     Head: Normocephalic and atraumatic.     Right Ear: External ear normal.     Left Ear: External ear normal.  Eyes:     Conjunctiva/sclera: Conjunctivae normal.     Pupils: Pupils are equal, round, and reactive to light.  Neck:     Trachea: Phonation normal.  Cardiovascular:     Rate and Rhythm: Normal rate and regular rhythm.     Heart sounds: Normal heart sounds.  Pulmonary:  Effort: Pulmonary effort is normal. No respiratory distress.     Breath sounds: No stridor. Wheezing present. No rhonchi.  Chest:     Chest wall: No tenderness.  Abdominal:     General: There is no distension.     Palpations: Abdomen is soft. There is no mass.     Tenderness: There is no abdominal tenderness. There is no guarding.     Hernia: No hernia is present.  Musculoskeletal:        General: Normal range of motion.     Cervical back: Normal range of motion and neck supple.  Skin:    General: Skin is warm and dry.  Neurological:     Mental Status: He is alert and oriented to person, place, and time.     Cranial Nerves: No cranial nerve deficit.     Sensory: No sensory deficit.     Motor: No abnormal muscle tone.     Coordination: Coordination normal.  Psychiatric:         Mood and Affect: Mood normal.        Behavior: Behavior normal.        Thought Content: Thought content normal.        Judgment: Judgment normal.     ED Results / Procedures / Treatments   Labs (all labs ordered are listed, but only abnormal results are displayed) Labs Reviewed  CBC - Abnormal; Notable for the following components:      Result Value   MCHC 36.7 (*)    All other components within normal limits  COMPREHENSIVE METABOLIC PANEL - Abnormal; Notable for the following components:   CO2 21 (*)    Glucose, Bld 102 (*)    All other components within normal limits  LIPASE, BLOOD    EKG EKG Interpretation  Date/Time:  Tuesday July 08 2021 16:31:38 EDT Ventricular Rate:  121 PR Interval:  130 QRS Duration: 82 QT Interval:  326 QTC Calculation: 462 R Axis:   210 Text Interpretation: Sinus tachycardia Biatrial enlargement Cannot rule out Inferior infarct , age undetermined Possible Anterolateral infarct , age undetermined Abnormal ECG When compared with ECG of 03-Nov-2018 16:14, PREVIOUS ECG IS PRESENT Since last tracing rate faster Otherwise no significant change Confirmed by Mancel Bale [phone]) on 07/08/2021 4:44:43 PM  Radiology DG Chest 2 View  Result Date: 07/08/2021 CLINICAL DATA:  Shortness of breath EXAM: CHEST - 2 VIEW COMPARISON:  Chest x-ray 11/03/2018 FINDINGS: Heart size and mediastinal contours are within normal limits. No suspicious pulmonary opacities identified. Lungs are hyperinflated. No pleural effusion or pneumothorax visualized. No acute osseous abnormality appreciated. IMPRESSION: No acute intrathoracic process identified. Electronically Signed   By: Jannifer Hick M.D.   On: 07/08/2021 16:57    Procedures Procedures    Medications Ordered in ED Medications  ondansetron (ZOFRAN-ODT) disintegrating tablet 8 mg (has no administration in time range)  sodium chloride 0.9 % bolus 1,000 mL (0 mLs Intravenous Stopped 07/08/21 1817)    ED  Course/ Medical Decision Making/ A&P                           Medical Decision Making Patient with chronic nausea and vomiting with weight loss of 60 pounds, over the last year.  He continues to smoke marijuana.  He has not followed up as directed when previously referred to GI.  He cites lack of insurance and money as reason for not getting follow-up care.  Problems Addressed:  Bilious vomiting with nausea: chronic illness or injury Weight loss: chronic illness or injury  Amount and/or Complexity of Data Reviewed Labs: ordered.    Details: CBC, metabolic panel, lipase-all normal  Risk Decision regarding hospitalization. Risk Details: Patient with chronic symptoms, vomiting and weight loss, continues to use marijuana.  Clinically well, with reassuring labs.  No indication for hospitalization.  Considered advanced imaging (such as CAT scan of the abdomen pelvis) however do not believe that is necessary at this time.  He does not require hospitalization.           Final Clinical Impression(s) / ED Diagnoses Final diagnoses:  Bilious vomiting with nausea  Weight loss    Rx / DC Orders ED Discharge Orders          Ordered    ondansetron (ZOFRAN) 8 MG tablet  Every 8 hours PRN        07/08/21 1933              Mancel Bale, MD 07/08/21 [phone]

## 2021-07-08 NOTE — ED Notes (Signed)
Vital signs stable. 

## 2021-09-16 ENCOUNTER — Emergency Department (HOSPITAL_BASED_OUTPATIENT_CLINIC_OR_DEPARTMENT_OTHER)
Admission: EM | Admit: 2021-09-16 | Discharge: 2021-09-16 | Discharge status: Against Medical Advice | Payer: Self-pay | Attending: Emergency Medicine | Admitting: Emergency Medicine

## 2021-09-16 ENCOUNTER — Other Ambulatory Visit: Payer: Self-pay

## 2021-09-16 ENCOUNTER — Encounter (HOSPITAL_BASED_OUTPATIENT_CLINIC_OR_DEPARTMENT_OTHER): Payer: Self-pay | Admitting: Emergency Medicine

## 2021-09-16 DIAGNOSIS — Z5321 Procedure and treatment not carried out due to patient leaving prior to being seen by health care provider: Secondary | ICD-10-CM | POA: Insufficient documentation

## 2021-09-16 DIAGNOSIS — R112 Nausea with vomiting, unspecified: Secondary | ICD-10-CM | POA: Insufficient documentation

## 2021-09-16 DIAGNOSIS — R109 Unspecified abdominal pain: Secondary | ICD-10-CM | POA: Insufficient documentation

## 2021-09-16 MED ORDER — ONDANSETRON 4 MG PO TBDP
4.0000 mg | ORAL_TABLET | Freq: Once | ORAL | Status: AC
  Administered 2021-09-16: 4 mg via ORAL
  Filled 2021-09-16: qty 1

## 2021-09-16 NOTE — ED Notes (Signed)
Pt requesting IV in triage. RN informed pt that we will straight stick for lab draw, place pt in lobby to wait for room where he will then get an IV. Pt again requesting IV, IV fluids and IV nausea meds. Also states "I need a bed to lay down. This is bullshit. I should have called ems and they would have put an IV in me" Pt refusing lab draw at this time due to not being able to get IV. Pt also requesting something to drink. RN informed pt to remain npo until pts n/v is under control. Pt later seen down at vending machines getting a drink.

## 2021-09-16 NOTE — ED Triage Notes (Signed)
Pt c/o abdominal pain, n/v since 3 am this morning. States this is an ongoing issue x 6 months. Mom reports she gave him her prescriptions (clarithromycin, omeprazole, and amoxicillin) states his sx resolved with these meds but sx returned after not taking these meds x 4 days.

## 2021-09-16 NOTE — ED Notes (Signed)
Still no answer, entire first floor ck, pt left after triage

## 2021-09-16 NOTE — ED Notes (Addendum)
Client called for, no answer

## 2021-10-26 ENCOUNTER — Emergency Department (HOSPITAL_BASED_OUTPATIENT_CLINIC_OR_DEPARTMENT_OTHER)
Admission: EM | Admit: 2021-10-26 | Discharge: 2021-10-26 | Disposition: A | Discharge status: Home / Self Care | Payer: Commercial Managed Care - HMO | Attending: Emergency Medicine | Admitting: Emergency Medicine

## 2021-10-26 ENCOUNTER — Encounter (HOSPITAL_BASED_OUTPATIENT_CLINIC_OR_DEPARTMENT_OTHER): Payer: Self-pay | Admitting: Emergency Medicine

## 2021-10-26 ENCOUNTER — Other Ambulatory Visit: Payer: Self-pay

## 2021-10-26 ENCOUNTER — Emergency Department (HOSPITAL_BASED_OUTPATIENT_CLINIC_OR_DEPARTMENT_OTHER): Payer: Commercial Managed Care - HMO

## 2021-10-26 DIAGNOSIS — R1084 Generalized abdominal pain: Secondary | ICD-10-CM | POA: Diagnosis not present

## 2021-10-26 DIAGNOSIS — R634 Abnormal weight loss: Secondary | ICD-10-CM | POA: Insufficient documentation

## 2021-10-26 DIAGNOSIS — R112 Nausea with vomiting, unspecified: Secondary | ICD-10-CM | POA: Diagnosis present

## 2021-10-26 LAB — COMPREHENSIVE METABOLIC PANEL
ALT: 44 U/L (ref 0–44)
AST: 35 U/L (ref 15–41)
Albumin: 5.1 g/dL — ABNORMAL HIGH (ref 3.5–5.0)
Alkaline Phosphatase: 84 U/L (ref 38–126)
Anion gap: 15 (ref 5–15)
BUN: 17 mg/dL (ref 6–20)
CO2: 23 mmol/L (ref 22–32)
Calcium: 10.4 mg/dL — ABNORMAL HIGH (ref 8.9–10.3)
Chloride: 100 mmol/L (ref 98–111)
Creatinine, Ser: 0.92 mg/dL (ref 0.61–1.24)
GFR, Estimated: >60 mL/min (ref >60)
Glucose, Bld: 99 mg/dL (ref 70–99)
Potassium: 3.8 mmol/L (ref 3.5–5.1)
Sodium: 138 mmol/L (ref 135–145)
Total Bilirubin: 1.6 mg/dL — ABNORMAL HIGH (ref 0.3–1.2)
Total Protein: 8.8 g/dL — ABNORMAL HIGH (ref 6.5–8.1)

## 2021-10-26 LAB — CBC WITH DIFFERENTIAL/PLATELET
Abs Immature Granulocytes: 0.03 10*3/uL (ref 0.00–0.07)
Basophils Absolute: 0.1 10*3/uL (ref 0.0–0.1)
Basophils Relative: 1 %
Eosinophils Absolute: 0.2 10*3/uL (ref 0.0–0.5)
Eosinophils Relative: 2 %
HCT: 42.8 % (ref 39.0–52.0)
Hemoglobin: 15.6 g/dL (ref 13.0–17.0)
Immature Granulocytes: 0 %
Lymphocytes Relative: 22 %
Lymphs Abs: 2.4 10*3/uL (ref 0.7–4.0)
MCH: 33.5 pg (ref 26.0–34.0)
MCHC: 36.4 g/dL — ABNORMAL HIGH (ref 30.0–36.0)
MCV: 92 fL (ref 80.0–100.0)
Monocytes Absolute: 0.9 10*3/uL (ref 0.1–1.0)
Monocytes Relative: 8 %
Neutro Abs: 7.4 10*3/uL (ref 1.7–7.7)
Neutrophils Relative %: 67 %
Platelets: 372 10*3/uL (ref 150–400)
RBC: 4.65 MIL/uL (ref 4.22–5.81)
RDW: 11.5 % (ref 11.5–15.5)
WBC: 10.9 10*3/uL — ABNORMAL HIGH (ref 4.0–10.5)
nRBC: 0 % (ref 0.0–0.2)

## 2021-10-26 LAB — URINALYSIS, ROUTINE W REFLEX MICROSCOPIC
Glucose, UA: NEGATIVE mg/dL
Hgb urine dipstick: NEGATIVE
Ketones, ur: >=80 mg/dL — AB
Leukocytes,Ua: NEGATIVE
Nitrite: NEGATIVE
Protein, ur: NEGATIVE mg/dL
Specific Gravity, Urine: 1.015 (ref 1.005–1.030)
pH: 5.5 (ref 5.0–8.0)

## 2021-10-26 LAB — HIV ANTIBODY (ROUTINE TESTING W REFLEX): HIV Screen 4th Generation wRfx: NONREACTIVE

## 2021-10-26 LAB — LIPASE, BLOOD: Lipase: 27 U/L (ref 11–51)

## 2021-10-26 MED ORDER — MORPHINE SULFATE (PF) 2 MG/ML IV SOLN
2.0000 mg | Freq: Once | INTRAVENOUS | Status: AC
  Administered 2021-10-26: 2 mg via INTRAVENOUS
  Filled 2021-10-26: qty 1

## 2021-10-26 MED ORDER — SODIUM CHLORIDE 0.9 % IV BOLUS
1000.0000 mL | Freq: Once | INTRAVENOUS | Status: AC
  Administered 2021-10-26: 1000 mL via INTRAVENOUS

## 2021-10-26 MED ORDER — IOHEXOL 300 MG/ML  SOLN
70.0000 mL | Freq: Once | INTRAMUSCULAR | Status: AC | PRN
  Administered 2021-10-26: 80 mL via INTRAVENOUS

## 2021-10-26 MED ORDER — ONDANSETRON HCL 4 MG/2ML IJ SOLN
4.0000 mg | Freq: Once | INTRAMUSCULAR | Status: AC
  Administered 2021-10-26: 4 mg via INTRAVENOUS
  Filled 2021-10-26: qty 2

## 2021-10-26 MED ORDER — ONDANSETRON 4 MG PO TBDP: 4.0000 mg | ORAL_TABLET | Freq: Three times a day (TID) | ORAL | 0 refills | Status: AC | PRN

## 2021-10-26 MED ORDER — HALOPERIDOL LACTATE 5 MG/ML IJ SOLN
1.0000 mg | Freq: Once | INTRAMUSCULAR | Status: AC
  Administered 2021-10-26: 1 mg via INTRAVENOUS
  Filled 2021-10-26: qty 1

## 2021-10-26 MED ORDER — DROPERIDOL 2.5 MG/ML IJ SOLN: 1.2500 mg | Freq: Once | INTRAMUSCULAR | Status: DC

## 2021-10-26 NOTE — ED Notes (Signed)
Patient sign pad was not working . Patient verbalize that he understood his discharge information

## 2021-10-26 NOTE — ED Triage Notes (Signed)
Patient reports n/v for three day. States  that he is vomiting up green and yellow fluid. States that he is not eating much.

## 2021-10-26 NOTE — ED Notes (Addendum)
Pt asking when he will be d/c, message sent to Provider Judson Roch, [place]

## 2021-10-26 NOTE — Discharge Instructions (Addendum)
As we discussed, your work-up in the ER today was reassuring for acute findings.  Laboratory evaluation and CT imaging did not show any emergent concerns today.  I have given you a referral to a gastroenterologist with a number to call to schedule an appointment for continued evaluation and management of your symptoms.  I have also given you a prescription for Zofran which is a nausea medication to help with any residual symptoms that you may have.  I have also given you information about the [place] [facility] where you can go to manage your additional health problems.  I recommend that you call them and schedule an appointment.  Additionally, you do have some labs that are pending at your discharge.  These do take a few days to come back.  I recommend that you monitor your MyChart online for these results.  If anything is positive, you will need to return for treatment.  Return if development of any new or worsening symptoms.

## 2021-10-26 NOTE — ED Notes (Signed)
Patient states that he could not urinate at this time

## 2021-10-26 NOTE — ED Notes (Signed)
Patient still cannt void 

## 2021-10-26 NOTE — ED Notes (Signed)
Key pad was not working patient agreed not to leave before being seen

## 2021-10-26 NOTE — ED Notes (Signed)
Pt provided with urinal for urine specimen

## 2021-10-26 NOTE — ED Provider Notes (Cosign Needed Addendum)
[facility] EMERGENCY DEPARTMENT Provider Note   CSN: [id number] Arrival date & time: 10/26/21  6967     History  Chief Complaint  Patient presents with   Emesis    Peter Massey is a 31 y.o. male.  Patient with no pertinent past medical history presents today with complaints of nausea, vomiting, and abdominal pain.  He states that same has been persistent for the last 3 days.  Endorses history of similar previously and has been seen for same and was told it was related to his marijuana use.  However, he states that he stopped using marijuana over a month ago and his symptoms have been persistent.  He also endorses substantial weight loss of around 60 lbs over the past 9 months since these symptoms began.  States that he has been given GI follow-up, however given he is uninsured he has not been able to schedule an appointment.  He denies any diarrhea, states he is having regular bowel movements and passing flatus.  He denies any hematuria or dysuria.  No hematochezia or melena.  Of note, patient does state that he was in prison up until about a year ago and that his symptoms began around 3 months after this.  The history is provided by the patient. No language interpreter was used.  Emesis Associated symptoms: abdominal pain        Home Medications Prior to Admission medications   Medication Sig Start Date End Date Taking? Authorizing Provider  cyclobenzaprine (FLEXERIL) 5 MG tablet Take 1 tablet (5 mg total) by mouth 3 (three) times daily as needed for muscle spasms. Patient not taking: Reported on 01/04/2015 12/03/13   Ward, Layla Maw, DO  doxycycline (VIBRAMYCIN) 100 MG capsule Take 1 capsule (100 mg total) by mouth 2 (two) times daily. One po bid x 7 days 08/31/18   Melene Plan, DO  ibuprofen (ADVIL,MOTRIN) 800 MG tablet Take 1 tablet (800 mg total) by mouth every 8 (eight) hours as needed for mild pain. Patient not taking: Reported on 01/04/2015 12/03/13   Ward,  Layla Maw, DO  omeprazole (PRILOSEC) 20 MG capsule Take 1 capsule (20 mg total) by mouth daily. 05/06/21   Terrilee Files, MD  ondansetron (ZOFRAN) 8 MG tablet Take 1 tablet (8 mg total) by mouth every 8 (eight) hours as needed for nausea or vomiting. 07/08/21   Mancel Bale, MD  predniSONE (DELTASONE) 50 MG tablet Take 1 tablet (50 mg total) by mouth daily with breakfast. 12/24/18   Law, Waylan Boga, PA-C  traMADol (ULTRAM) 50 MG tablet Take 1 tablet (50 mg total) by mouth every 6 (six) hours as needed. Patient not taking: Reported on 01/04/2015 12/03/13   Ward, Layla Maw, DO      Allergies    Ibuprofen    Review of Systems   Review of Systems  Gastrointestinal:  Positive for abdominal pain, nausea and vomiting.  All other systems reviewed and are negative.   Physical Exam Updated Vital Signs BP (!) 124/90   Pulse (!) 111   Temp (!) 97.4 F (36.3 C)   Resp 18   SpO2 100%  Physical Exam Vitals and nursing note reviewed.  Constitutional:      General: He is not in acute distress.    Appearance: Normal appearance. He is not ill-appearing, toxic-appearing or diaphoretic.  HENT:     Head: Normocephalic and atraumatic.  Cardiovascular:     Rate and Rhythm: Normal rate and regular rhythm.  Heart sounds: Normal heart sounds.  Pulmonary:     Effort: Pulmonary effort is normal. No respiratory distress.     Breath sounds: Normal breath sounds.  Abdominal:     General: Abdomen is flat.     Palpations: Abdomen is soft.     Tenderness: There is no guarding or rebound.     Comments: Mild generalized tenderness to palpation throughout the abdomen.  Musculoskeletal:        General: Normal range of motion.     Cervical back: Normal range of motion.  Skin:    General: Skin is warm and dry.  Neurological:     General: No focal deficit present.     Mental Status: He is alert.  Psychiatric:        Mood and Affect: Mood normal.        Behavior: Behavior normal.    ED Results /  Procedures / Treatments   Labs (all labs ordered are listed, but only abnormal results are displayed) Labs Reviewed  CBC WITH DIFFERENTIAL/PLATELET - Abnormal; Notable for the following components:      Result Value   WBC 10.9 (*)    MCHC 36.4 (*)    All other components within normal limits  COMPREHENSIVE METABOLIC PANEL - Abnormal; Notable for the following components:   Calcium 10.4 (*)    Total Protein 8.8 (*)    Albumin 5.1 (*)    Total Bilirubin 1.6 (*)    All other components within normal limits  LIPASE, BLOOD  URINALYSIS, ROUTINE W REFLEX MICROSCOPIC  RPR  HIV ANTIBODY (ROUTINE TESTING W REFLEX)    EKG None  Radiology CT ABDOMEN PELVIS W CONTRAST  Result Date: 10/26/2021 CLINICAL DATA:  Abdominal pain, acute, nonlocalized. Nausea and vomiting. EXAM: CT ABDOMEN AND PELVIS WITH CONTRAST TECHNIQUE: Multidetector CT imaging of the abdomen and pelvis was performed using the standard protocol following bolus administration of intravenous contrast. RADIATION DOSE REDUCTION: This exam was performed according to the departmental dose-optimization program which includes automated exposure control, adjustment of the mA and/or kV according to patient size and/or use of iterative reconstruction technique. CONTRAST:  14mL OMNIPAQUE IOHEXOL 300 MG/ML  SOLN COMPARISON:  None Available. FINDINGS: Lower chest: The lung bases are clear without focal nodule, mass, or airspace disease. Heart size is normal. No significant pleural or pericardial effusion is present. Hepatobiliary: No focal liver abnormality is seen. No gallstones, gallbladder wall thickening, or biliary dilatation. Pancreas: Unremarkable. No pancreatic ductal dilatation or surrounding inflammatory changes. Spleen: Normal in size without focal abnormality. Adrenals/Urinary Tract: Adrenal glands are unremarkable. Kidneys are normal, without renal calculi, focal lesion, or hydronephrosis. Bladder is unremarkable. Stomach/Bowel: The  stomach and duodenum are within normal limits. Small bowel is unremarkable. Terminal ileum is within normal limits. The cecum extends into the anatomic pelvis. The appendix is not discretely visualized. No secondary inflammatory changes are evident. Ascending and transverse colon is within normal limits. The descending and sigmoid colon is normal. Vascular/Lymphatic: No significant vascular findings are present. No enlarged abdominal or pelvic lymph nodes. Reproductive: Prostate is unremarkable. Other: No abdominal wall hernia or abnormality. No abdominopelvic ascites. Musculoskeletal: Transitional anatomy is present. L5 is partially sacralized. Bilateral L4 pars defects present. 5 mm anterolisthesis is present at L4-5. Slight retrolisthesis is present at L3-4. Vertebral body heights are maintained. No focal osseous lesions are present. IMPRESSION: 1. No acute or focal lesion to explain the patient's symptoms. 2. 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 3. Slight  retrolisthesis at L3-4. Electronically Signed   By: Marin Roberts M.D.   On: 10/26/2021 11:41    Procedures Procedures    Medications Ordered in ED Medications  sodium chloride 0.9 % bolus 1,000 mL ( Intravenous Stopped 10/26/21 1124)  ondansetron (ZOFRAN) injection 4 mg (4 mg Intravenous Given 10/26/21 1002)  morphine (PF) 2 MG/ML injection 2 mg (2 mg Intravenous Given 10/26/21 1007)  iohexol (OMNIPAQUE) 300 MG/ML solution 70 mL (80 mLs Intravenous Contrast Given 10/26/21 1111)  haloperidol lactate (HALDOL) injection 1 mg (1 mg Intravenous Given 10/26/21 1403)    ED Course/ Medical Decision Making/ A&P                           Medical Decision Making Amount and/or Complexity of Data Reviewed Labs: ordered. Radiology: ordered.  Risk Prescription drug management.   This patient is a 31 y.o. male who presents to the ED for concern of abdominal pain, nausea, and vomiting, this involves an extensive number  of treatment options, and is a complaint that carries with it a high risk of complications and morbidity. The emergent differential diagnosis prior to evaluation includes, but is not limited to,  The differential diagnosis for generalized abdominal pain includes, but is not limited to AAA, gastroenteritis, appendicitis, Bowel obstruction, Bowel perforation. Gastroparesis, DKA, Hernia, Inflammatory bowel disease, mesenteric ischemia, pancreatitis, peritonitis SBP, volvulus.  This is not an exhaustive differential.   Past Medical History / Co-morbidities / Social History: Hx similar symptoms previously, dx cannabinoid hyperemesis  Additional history: Chart reviewed. Pertinent results include: pt had reassuring labs previously when he presented with similar symptoms but has not yet had abdominal imaging  Physical Exam: Physical exam performed. The pertinent findings include: patient clearly underweight with generalized abdominal tenderness to palpation  Lab Tests: I ordered, and personally interpreted labs.  The pertinent results include:  WBC 10.9, T bili 1.6. HIV and RPR pending.  UA with ketonuria, noninfectious.  No other acute laboratory findings.   Imaging Studies: I ordered imaging studies including CT abdomen pelvis. I independently visualized and interpreted imaging which showed no acute findings. I agree with the radiologist interpretation.     Medications: I ordered medication including haldol, morphine, zofran, and fluids  for pain, nausea, and dehydration. Reevaluation of the patient after these medicines showed that the patient resolved. I have reviewed the patients home medicines and have made adjustments as needed.    Disposition: After consideration of the diagnostic results and the patients response to treatment, I feel that patient is stable for discharge.   Patient is nontoxic, nonseptic appearing, in no apparent distress.  Patient's pain and other symptoms adequately  managed in emergency department.  Fluid bolus given.  Labs, imaging and vitals reviewed.  Patient does not meet the SIRS or Sepsis criteria.  On repeat exam patient does not have a surgical abdomin and there are no peritoneal signs.  No indication of appendicitis, bowel obstruction, bowel perforation, cholecystitis, diverticulitis.  After administration of Haldol, patient states that his symptoms have completely resolved and he is ready to be discharged.  He is able to eat and drink without any subsequent episodes of nausea or vomiting.  I did order HIV and syphilis testing given that patient was recently incarcerated and is having this unexplained weight loss.  I have informed him that these results are pending and he will need to monitor his MyChart for the results.  Plan for discharge with zofran for  symptomatic relief.  Also given GI referral with a number to call to schedule an appointment for continued evaluation and management of his symptoms.  I have emphasized the importance of making this follow-up appointment.  Patient is understanding and amenable with plan, educated on red flag symptoms of prompt immediate return.  Patient discharged in stable condition.   I discussed this case with my attending physician Dr. Lockie Mola who cosigned this note including patient's presenting symptoms, physical exam, and planned diagnostics and interventions. Attending physician stated agreement with plan or made changes to plan which were implemented.     Final Clinical Impression(s) / ED Diagnoses Final diagnoses:  Generalized abdominal pain  Nausea and vomiting, unspecified vomiting type  Weight loss, non-intentional    Rx / DC Orders ED Discharge Orders          Ordered    ondansetron (ZOFRAN-ODT) 4 MG disintegrating tablet  Every 8 hours PRN        10/26/21 1509          An After Visit Summary was printed and given to the patient.     Vear Clock 10/26/21 1521    Silva Bandy,  PA-C 10/26/21 1521    Joselynn Amoroso, Shawn Route, PA-C 10/26/21 1522    Virgina Norfolk, DO 10/29/21 1120

## 2021-10-27 LAB — RPR: RPR Ser Ql: NONREACTIVE

## 2022-06-15 ENCOUNTER — Encounter (HOSPITAL_BASED_OUTPATIENT_CLINIC_OR_DEPARTMENT_OTHER): Payer: Self-pay | Admitting: Emergency Medicine

## 2022-06-15 ENCOUNTER — Other Ambulatory Visit: Payer: Self-pay

## 2022-06-15 ENCOUNTER — Emergency Department (HOSPITAL_BASED_OUTPATIENT_CLINIC_OR_DEPARTMENT_OTHER)
Admission: EM | Admit: 2022-06-15 | Discharge: 2022-06-15 | Disposition: A | Discharge status: Home / Self Care | Payer: Self-pay | Attending: Emergency Medicine | Admitting: Emergency Medicine

## 2022-06-15 DIAGNOSIS — K029 Dental caries, unspecified: Secondary | ICD-10-CM | POA: Insufficient documentation

## 2022-06-15 MED ORDER — ACETAMINOPHEN 500 MG PO TABS
1000.0000 mg | ORAL_TABLET | Freq: Once | ORAL | Status: AC
  Administered 2022-06-15: 1000 mg via ORAL
  Filled 2022-06-15: qty 2

## 2022-06-15 MED ORDER — AMOXICILLIN-POT CLAVULANATE 875-125 MG PO TABS
1.0000 | ORAL_TABLET | Freq: Once | ORAL | Status: AC
  Administered 2022-06-15: 1 via ORAL
  Filled 2022-06-15: qty 1

## 2022-06-15 MED ORDER — AMOXICILLIN-POT CLAVULANATE 875-125 MG PO TABS: 1.0000 | ORAL_TABLET | Freq: Two times a day (BID) | ORAL | 0 refills | Status: AC

## 2022-06-15 NOTE — ED Triage Notes (Signed)
Patient c/o right sided dental pain x 3 days.

## 2022-06-15 NOTE — Discharge Instructions (Addendum)
It was a pleasure taking care of you today!   You will be prescribed Augmentin, take as directed and ensure to complete the entire course of the antibiotic. You may take over the counter 500 mg Tylenol every 6 hours as directed for no more than 7 days. Attached is information for the on-call Dentist, call and set up a follow up appointment regarding todays ED visit.  Attached you will find a resource guide for dentists in the area, call and set up a follow up appointment. Return to the Emergency Department if you are experiencing increasing/worsening symptoms.

## 2022-06-15 NOTE — ED Provider Notes (Signed)
[place] EMERGENCY DEPARTMENT AT [facility] Provider Note   CSN: [id number] Arrival date & time: 06/15/22  1628     History  Chief Complaint  Patient presents with   Dental Pain    Peter Massey is a 32 y.o. male who presents to the ED with concerns for right lower dental pain x 3 days. Doesn't have a dentist at this time. Tried tylenol without relief of his symptoms. Denies fever, drainage, or trouble swallowing.   The history is provided by the patient. No language interpreter was used.       Home Medications Prior to Admission medications   Medication Sig Start Date End Date Taking? Authorizing Provider  amoxicillin-clavulanate (AUGMENTIN) 875-125 MG tablet Take 1 tablet by mouth every 12 (twelve) hours. 06/15/22  Yes Rajean Desantiago A, PA-C  cyclobenzaprine (FLEXERIL) 5 MG tablet Take 1 tablet (5 mg total) by mouth 3 (three) times daily as needed for muscle spasms. Patient not taking: Reported on 01/04/2015 12/03/13   Ward, Layla Maw, DO  doxycycline (VIBRAMYCIN) 100 MG capsule Take 1 capsule (100 mg total) by mouth 2 (two) times daily. One po bid x 7 days 08/31/18   Melene Plan, DO  ibuprofen (ADVIL,MOTRIN) 800 MG tablet Take 1 tablet (800 mg total) by mouth every 8 (eight) hours as needed for mild pain. Patient not taking: Reported on 01/04/2015 12/03/13   Ward, Layla Maw, DO  omeprazole (PRILOSEC) 20 MG capsule Take 1 capsule (20 mg total) by mouth daily. 05/06/21   Terrilee Files, MD  ondansetron (ZOFRAN) 8 MG tablet Take 1 tablet (8 mg total) by mouth every 8 (eight) hours as needed for nausea or vomiting. 07/08/21   Mancel Bale, MD  ondansetron (ZOFRAN-ODT) 4 MG disintegrating tablet Take 1 tablet (4 mg total) by mouth every 8 (eight) hours as needed for nausea or vomiting. 10/26/21   Smoot, Shawn Route, PA-C  predniSONE (DELTASONE) 50 MG tablet Take 1 tablet (50 mg total) by mouth daily with breakfast. 12/24/18   Law, Waylan Boga, PA-C  traMADol (ULTRAM) 50 MG  tablet Take 1 tablet (50 mg total) by mouth every 6 (six) hours as needed. Patient not taking: Reported on 01/04/2015 12/03/13   Ward, Layla Maw, DO      Allergies    Ibuprofen    Review of Systems   Review of Systems  All other systems reviewed and are negative.   Physical Exam Updated Vital Signs BP (!) 128/91 (BP Location: Right Arm)   Pulse 96   Temp 98.2 F (36.8 C)   Resp 20   Ht 5\' 9"  (1.753 m)   Wt 45.4 kg   SpO2 99%   BMI 14.77 kg/m  Physical Exam Vitals and nursing note reviewed.  Constitutional:      General: He is not in acute distress.    Appearance: He is not ill-appearing.  HENT:     Head: Normocephalic and atraumatic.     Right Ear: External ear normal.     Left Ear: External ear normal.     Nose: Nose normal.     Mouth/Throat:     Mouth: Mucous membranes are moist. Mucous membranes are not dry.     Dentition: Abnormal dentition. Dental tenderness and dental caries present.     Tongue: Tongue does not deviate from midline.     Pharynx: Oropharynx is clear. Uvula midline. No oropharyngeal exudate or posterior oropharyngeal erythema.     Tonsils: No tonsillar exudate or tonsillar  abscesses.     Comments: Multiple dental caries noted throughout. Tenderness to palpation to right upper and lower gum line. No fluctuance noted. No trismus. No retropharyngeal abscess. No peritonsillar abscess noted. Uvula midline. Eyes:     Extraocular Movements: Extraocular movements intact.  Cardiovascular:     Rate and Rhythm: Normal rate and regular rhythm.     Pulses: Normal pulses.     Heart sounds: Normal heart sounds.  Pulmonary:     Effort: Pulmonary effort is normal. No respiratory distress.     Breath sounds: Normal breath sounds.  Abdominal:     General: Bowel sounds are normal. There is no distension.     Palpations: Abdomen is soft. There is no mass.     Tenderness: There is no abdominal tenderness.  Musculoskeletal:        General: Normal range of motion.      Cervical back: Neck supple.  Lymphadenopathy:     Head:     Right side of head: No submental, submandibular, tonsillar, preauricular or posterior auricular adenopathy.     Left side of head: No submental, submandibular, tonsillar, preauricular or posterior auricular adenopathy.     Cervical: No cervical adenopathy.  Skin:    General: Skin is warm and dry.     Findings: No rash.  Neurological:     Mental Status: He is alert.  Psychiatric:        Behavior: Behavior normal.     ED Results / Procedures / Treatments   Labs (all labs ordered are listed, but only abnormal results are displayed) Labs Reviewed - No data to display  EKG None  Radiology No results found.  Procedures Procedures    Medications Ordered in ED Medications  acetaminophen (TYLENOL) tablet 1,000 mg (has no administration in time range)  amoxicillin-clavulanate (AUGMENTIN) 875-125 MG per tablet 1 tablet (has no administration in time range)    ED Course/ Medical Decision Making/ A&P                             Medical Decision Making Risk OTC drugs. Prescription drug management.   Patient presents to the ED with right lower dental pain onset 3 days.  Patient does not have a dentist.  Vital signs pt afebrile.  On exam patient with Multiple dental caries noted throughout. Tenderness to palpation to right upper and lower gum line. No fluctuance noted. No trismus. No retropharyngeal abscess. No peritonsillar abscess noted. Uvula midline..  Differential diagnosis includes pharyngeal abscess, dental abscess, Ludwig's angina.   Medications:  I ordered medication including tylenol and augmentin for symptom management I have reviewed the patients home medicines and have made adjustments as needed  Disposition: Patient presentation suspicious for dentalgia.  No abscess requiring immediate incision and drainage.  Exam not concerning for Ludwig's angina or pharyngeal abscess. After consideration of the  diagnostic results and the patients response to treatment, I feel that the patient would benefit from Discharge home. Will treat with augmentin prescription.  Provided resource guide for dentists in the area, patient structured to follow-up with a dentist. Pt given information for on-call dentist. Supportive care measures and strict return precautions discussed with patient at bedside. Pt acknowledges and verbalizes understanding. Pt appears safe for discharge. Follow up as indicated in discharge paperwork.  This chart was dictated using voice recognition software, Dragon. Despite the best efforts of this provider to proofread and correct errors, errors may still occur which  can change documentation meaning.  Final Clinical Impression(s) / ED Diagnoses Final diagnoses:  Pain due to dental caries    Rx / DC Orders ED Discharge Orders          Ordered    amoxicillin-clavulanate (AUGMENTIN) 875-125 MG tablet  Every 12 hours        06/15/22 1649              Yunior Jain A, PA-C 06/15/22 1653    Virgina Norfolk, DO 06/15/22 1946

## 2022-11-18 ENCOUNTER — Other Ambulatory Visit: Payer: Self-pay

## 2022-11-18 ENCOUNTER — Encounter (HOSPITAL_BASED_OUTPATIENT_CLINIC_OR_DEPARTMENT_OTHER): Payer: Self-pay | Admitting: Emergency Medicine

## 2022-11-18 ENCOUNTER — Emergency Department (HOSPITAL_BASED_OUTPATIENT_CLINIC_OR_DEPARTMENT_OTHER)
Admission: EM | Admit: 2022-11-18 | Discharge: 2022-11-18 | Disposition: A | Discharge status: Home / Self Care | Payer: Self-pay | Attending: Emergency Medicine | Admitting: Emergency Medicine

## 2022-11-18 DIAGNOSIS — R112 Nausea with vomiting, unspecified: Secondary | ICD-10-CM | POA: Insufficient documentation

## 2022-11-18 DIAGNOSIS — R109 Unspecified abdominal pain: Secondary | ICD-10-CM | POA: Insufficient documentation

## 2022-11-18 LAB — COMPREHENSIVE METABOLIC PANEL
ALT: 15 U/L (ref 0–44)
AST: 17 U/L (ref 15–41)
Albumin: 4.6 g/dL (ref 3.5–5.0)
Alkaline Phosphatase: 87 U/L (ref 38–126)
Anion gap: 12 (ref 5–15)
BUN: 6 mg/dL (ref 6–20)
CO2: 23 mmol/L (ref 22–32)
Calcium: 9.3 mg/dL (ref 8.9–10.3)
Chloride: 101 mmol/L (ref 98–111)
Creatinine, Ser: 0.73 mg/dL (ref 0.61–1.24)
GFR, Estimated: >60 mL/min (ref >60)
Glucose, Bld: 99 mg/dL (ref 70–99)
Potassium: 4 mmol/L (ref 3.5–5.1)
Sodium: 136 mmol/L (ref 135–145)
Total Bilirubin: 1.2 mg/dL — ABNORMAL HIGH (ref <1.2)
Total Protein: 8 g/dL (ref 6.5–8.1)

## 2022-11-18 LAB — CBC WITH DIFFERENTIAL/PLATELET
Abs Immature Granulocytes: 0.03 10*3/uL (ref 0.00–0.07)
Basophils Absolute: 0 10*3/uL (ref 0.0–0.1)
Basophils Relative: 0 %
Eosinophils Absolute: 0.2 10*3/uL (ref 0.0–0.5)
Eosinophils Relative: 3 %
HCT: 35.8 % — ABNORMAL LOW (ref 39.0–52.0)
Hemoglobin: 13 g/dL (ref 13.0–17.0)
Immature Granulocytes: 0 %
Lymphocytes Relative: 15 %
Lymphs Abs: 1.2 10*3/uL (ref 0.7–4.0)
MCH: 32.8 pg (ref 26.0–34.0)
MCHC: 36.3 g/dL — ABNORMAL HIGH (ref 30.0–36.0)
MCV: 90.4 fL (ref 80.0–100.0)
Monocytes Absolute: 0.4 10*3/uL (ref 0.1–1.0)
Monocytes Relative: 5 %
Neutro Abs: 6.4 10*3/uL (ref 1.7–7.7)
Neutrophils Relative %: 77 %
Platelets: 323 10*3/uL (ref 150–400)
RBC: 3.96 MIL/uL — ABNORMAL LOW (ref 4.22–5.81)
RDW: 12.1 % (ref 11.5–15.5)
WBC: 8.3 10*3/uL (ref 4.0–10.5)
nRBC: 0 % (ref 0.0–0.2)

## 2022-11-18 LAB — LIPASE, BLOOD: Lipase: 24 U/L (ref 11–51)

## 2022-11-18 LAB — MAGNESIUM: Magnesium: 2.1 mg/dL (ref 1.7–2.4)

## 2022-11-18 MED ORDER — ONDANSETRON HCL 4 MG/2ML IJ SOLN: 4.0000 mg | Freq: Once | INTRAMUSCULAR | Status: DC | PRN

## 2022-11-18 MED ORDER — HYDROMORPHONE HCL 1 MG/ML IJ SOLN
0.5000 mg | Freq: Once | INTRAMUSCULAR | Status: AC
  Administered 2022-11-18: 0.5 mg via INTRAVENOUS
  Filled 2022-11-18: qty 1

## 2022-11-18 MED ORDER — FAMOTIDINE IN NACL 20-0.9 MG/50ML-% IV SOLN
20.0000 mg | Freq: Once | INTRAVENOUS | Status: AC
  Administered 2022-11-18: 20 mg via INTRAVENOUS
  Filled 2022-11-18: qty 50

## 2022-11-18 MED ORDER — DROPERIDOL 2.5 MG/ML IJ SOLN
1.2500 mg | Freq: Once | INTRAMUSCULAR | Status: AC
  Administered 2022-11-18: 1.25 mg via INTRAVENOUS
  Filled 2022-11-18: qty 2

## 2022-11-18 MED ORDER — OMEPRAZOLE 20 MG PO CPDR: 20.0000 mg | DELAYED_RELEASE_CAPSULE | Freq: Every day | ORAL | 2 refills | Status: AC

## 2022-11-18 MED ORDER — FAMOTIDINE 20 MG PO TABS: 20.0000 mg | ORAL_TABLET | Freq: Two times a day (BID) | ORAL | 2 refills | Status: AC

## 2022-11-18 NOTE — ED Triage Notes (Signed)
Gl abd pain , emesis  since this morning .  Reports his first vomit was "black" .

## 2022-11-18 NOTE — ED Provider Notes (Signed)
[place] [facility] Provider Note   CSN: [id number] Arrival date & time: 11/18/22  1007     History  Chief Complaint  Patient presents with   Abdominal Pain   Emesis    Peter Massey is a 32 y.o. male presenting to the ED with abdominal pain, nausea and vomiting.  Patient reports that he ate a large [place] cheese steak last night and began to feel sick afterwards.  He said he has had vomiting all night.  He noted that 1 episode of vomiting had "black vomit".  He says he had a small bowel movement today, but is moving his bowels regularly.  He has had histories of cyclical vomiting, or ED visits for reported "bilious vomiting".  He does suffer from severe reflux symptoms and is not on any prescribed medicine.  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  Denies any history of abdominal surgery.  HPI     Home Medications Prior to Admission medications   Medication Sig Start Date End Date Taking? Authorizing Provider  famotidine (PEPCID) 20 MG tablet Take 1 tablet (20 mg total) by mouth 2 (two) times daily. 11/18/22 12/18/22 Yes Hilmar Moldovan, Kermit Balo, MD  omeprazole (PRILOSEC) 20 MG capsule Take 1 capsule (20 mg total) by mouth daily. 11/18/22 12/18/22 Yes Makyah Lavigne, Kermit Balo, MD  amoxicillin-clavulanate (AUGMENTIN) 875-125 MG tablet Take 1 tablet by mouth every 12 (twelve) hours. 06/15/22   Blue, Soijett A, PA-C  cyclobenzaprine (FLEXERIL) 5 MG tablet Take 1 tablet (5 mg total) by mouth 3 (three) times daily as needed for muscle spasms. Patient not taking: Reported on 01/04/2015 12/03/13   Ward, Layla Maw, DO  doxycycline (VIBRAMYCIN) 100 MG capsule Take 1 capsule (100 mg total) by mouth 2 (two) times daily. One po bid x 7 days 08/31/18   Melene Plan, DO  ibuprofen (ADVIL,MOTRIN) 800 MG tablet Take 1 tablet (800 mg total) by mouth every 8 (eight) hours  as needed for mild pain. Patient not taking: Reported on 01/04/2015 12/03/13   Ward, Layla Maw, DO  omeprazole (PRILOSEC) 20 MG capsule Take 1 capsule (20 mg total) by mouth daily. 05/06/21   Terrilee Files, MD  ondansetron (ZOFRAN) 8 MG tablet Take 1 tablet (8 mg total) by mouth every 8 (eight) hours as needed for nausea or vomiting. 07/08/21   Mancel Bale, MD  ondansetron (ZOFRAN-ODT) 4 MG disintegrating tablet Take 1 tablet (4 mg total) by mouth every 8 (eight) hours as needed for nausea or vomiting. 10/26/21   Smoot, Shawn Route, PA-C  predniSONE (DELTASONE) 50 MG tablet Take 1 tablet (50 mg total) by mouth daily with breakfast. 12/24/18   Law, Waylan Boga, PA-C  traMADol (ULTRAM) 50 MG tablet Take 1 tablet (50 mg total) by mouth every 6 (six) hours as needed. Patient not taking: Reported on 01/04/2015 12/03/13   Ward, Layla Maw, DO      Allergies    Ibuprofen    Review of Systems   Review of Systems  Physical Exam Updated Vital Signs BP (!) 143/85   Pulse 87   Temp 98.1 F (36.7 C) (Oral)   Resp 18   Wt 63.5 kg   SpO2 100%   BMI 20.67 kg/m  Physical Exam Constitutional:      General: He is not in acute distress. HENT:     Head: Normocephalic  and atraumatic.  Eyes:     Conjunctiva/sclera: Conjunctivae normal.     Pupils: Pupils are equal, round, and reactive to light.  Cardiovascular:     Rate and Rhythm: Normal rate and regular rhythm.  Pulmonary:     Effort: Pulmonary effort is normal. No respiratory distress.  Abdominal:     General: There is no distension.     Tenderness: There is generalized abdominal tenderness.  Skin:    General: Skin is warm and dry.  Neurological:     General: No focal deficit present.     Mental Status: He is alert. Mental status is at baseline.  Psychiatric:        Mood and Affect: Mood normal.        Behavior: Behavior normal.     ED Results / Procedures / Treatments   Labs (all labs ordered are listed, but only abnormal results  are displayed) Labs Reviewed  COMPREHENSIVE METABOLIC PANEL - Abnormal; Notable for the following components:      Result Value   Total Bilirubin 1.2 (*)    All other components within normal limits  CBC WITH DIFFERENTIAL/PLATELET - Abnormal; Notable for the following components:   RBC 3.96 (*)    HCT 35.8 (*)    MCHC 36.3 (*)    All other components within normal limits  LIPASE, BLOOD  MAGNESIUM  URINALYSIS, ROUTINE W REFLEX MICROSCOPIC    EKG None  Radiology No results found.  Procedures Procedures    Medications Ordered in ED Medications  ondansetron (ZOFRAN) injection 4 mg (has no administration in time range)  droperidol (INAPSINE) 2.5 MG/ML injection 1.25 mg (1.25 mg Intravenous Given 11/18/22 1113)  HYDROmorphone (DILAUDID) injection 0.5 mg (0.5 mg Intravenous Given 11/18/22 1113)  famotidine (PEPCID) IVPB 20 mg premix (0 mg Intravenous Stopped 11/18/22 1159)    ED Course/ Medical Decision Making/ A&P                                 Medical Decision Making Amount and/or Complexity of Data Reviewed Labs: ordered. ECG/medicine tests: ordered.  Risk Prescription drug management.   This patient presents to the ED with concern for epigastric abdominal pain, nausea vomiting. This involves an extensive number of treatment options, and is a complaint that carries with it a high risk of complications and morbidity.  The differential diagnosis includes gastritis versus cyclical vomiting versus gastroparesis versus biliary disease versus pancreatitis versus other   I ordered and personally interpreted labs.  The pertinent results include: No emergent findings   The patient was maintained on a cardiac monitor.  I personally viewed and interpreted the cardiac monitored which showed an underlying rhythm of: Sinus rhythm   I ordered medication including IV pain and nausea medication, IV Pepcid  I have reviewed the patients home medicines and have made adjustments as  needed  Test Considered: Given his clinical history, unremarkable CT of the abdomen last year, and normal blood testing today, do not believe he is requiring a CT scan of the abdomen.  Low suspicion for acute pancreatitis, colitis, bowel obstruction, appendicitis, or other surgical emergency.  After the interventions noted above, I reevaluated the patient and found that they have: improved -patient reporting significant improvement of his pain and is very adamant that he has need to leave.  He reports an urgent issue he needs to attend to.  Dispostion:  After consideration of the diagnostic results and  the patients response to treatment, I feel that the patent would benefit from outpatient follow-up.  Encouraged the patient to follow-up with gastroenterology.  He also prescribed him suspected reflux medication of omeprazole and Pepcid.  He verbalized understanding.         Final Clinical Impression(s) / ED Diagnoses Final diagnoses:  Nausea and vomiting, unspecified vomiting type    Rx / DC Orders ED Discharge Orders          Ordered    omeprazole (PRILOSEC) 20 MG capsule  Daily        11/18/22 1157    famotidine (PEPCID) 20 MG tablet  2 times daily        11/18/22 1157              Terald Sleeper, MD 11/18/22 1159
